# Patient Record
Sex: Female | Born: 1980 | Race: White | Hispanic: No | Marital: Married | Smoking: Former smoker
Health system: Southern US, Community
[De-identification: ages and names within clinical notes are randomized; demographics above are authoritative.]

## PROBLEM LIST (undated history)

## (undated) DIAGNOSIS — M329 Systemic lupus erythematosus, unspecified: Secondary | ICD-10-CM

## (undated) DIAGNOSIS — M3500 Sicca syndrome, unspecified: Secondary | ICD-10-CM

## (undated) DIAGNOSIS — K589 Irritable bowel syndrome without diarrhea: Secondary | ICD-10-CM

## (undated) DIAGNOSIS — Z8619 Personal history of other infectious and parasitic diseases: Secondary | ICD-10-CM

## (undated) DIAGNOSIS — J45909 Unspecified asthma, uncomplicated: Secondary | ICD-10-CM

## (undated) DIAGNOSIS — M8580 Other specified disorders of bone density and structure, unspecified site: Secondary | ICD-10-CM

## (undated) DIAGNOSIS — IMO0002 Reserved for concepts with insufficient information to code with codable children: Secondary | ICD-10-CM

## (undated) DIAGNOSIS — K219 Gastro-esophageal reflux disease without esophagitis: Secondary | ICD-10-CM

## (undated) DIAGNOSIS — B009 Herpesviral infection, unspecified: Secondary | ICD-10-CM

## (undated) DIAGNOSIS — R87629 Unspecified abnormal cytological findings in specimens from vagina: Secondary | ICD-10-CM

## (undated) HISTORY — PX: BREAST SURGERY: SHX581

## (undated) HISTORY — DX: Other specified disorders of bone density and structure, unspecified site: M85.80

## (undated) HISTORY — DX: Personal history of other infectious and parasitic diseases: Z86.19

## (undated) HISTORY — PX: AUGMENTATION MAMMAPLASTY: SUR837

## (undated) HISTORY — PX: GYNECOLOGIC CRYOSURGERY: SHX857

## (undated) HISTORY — PX: MOUTH SURGERY: SHX715

## (undated) HISTORY — DX: Herpesviral infection, unspecified: B00.9

## (undated) HISTORY — DX: Irritable bowel syndrome, unspecified: K58.9

## (undated) HISTORY — DX: Gastro-esophageal reflux disease without esophagitis: K21.9

## (undated) HISTORY — DX: Unspecified abnormal cytological findings in specimens from vagina: R87.629

---
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
## 2001-02-04 ENCOUNTER — Other Ambulatory Visit: Admission: RE | Admit: 2001-02-04 | Discharge: 2001-02-04 | Payer: Self-pay | Admitting: Gynecology

## 2002-01-27 ENCOUNTER — Other Ambulatory Visit: Admission: RE | Admit: 2002-01-27 | Discharge: 2002-01-27 | Payer: Self-pay | Admitting: Gynecology

## 2003-01-23 ENCOUNTER — Other Ambulatory Visit: Admission: RE | Admit: 2003-01-23 | Discharge: 2003-01-23 | Payer: Self-pay | Admitting: Gynecology

## 2013-01-09 ENCOUNTER — Emergency Department (HOSPITAL_BASED_OUTPATIENT_CLINIC_OR_DEPARTMENT_OTHER)
Admission: EM | Admit: 2013-01-09 | Discharge: 2013-01-09 | Disposition: A | Discharge status: Home / Self Care | Payer: 59 | Attending: Emergency Medicine | Admitting: Emergency Medicine

## 2013-01-09 ENCOUNTER — Encounter (HOSPITAL_BASED_OUTPATIENT_CLINIC_OR_DEPARTMENT_OTHER): Payer: Self-pay | Admitting: *Deleted

## 2013-01-09 DIAGNOSIS — Z79899 Other long term (current) drug therapy: Secondary | ICD-10-CM | POA: Insufficient documentation

## 2013-01-09 DIAGNOSIS — Z209 Contact with and (suspected) exposure to unspecified communicable disease: Secondary | ICD-10-CM

## 2013-01-09 DIAGNOSIS — Z2089 Contact with and (suspected) exposure to other communicable diseases: Secondary | ICD-10-CM | POA: Insufficient documentation

## 2013-01-09 DIAGNOSIS — Z8739 Personal history of other diseases of the musculoskeletal system and connective tissue: Secondary | ICD-10-CM | POA: Insufficient documentation

## 2013-01-09 HISTORY — DX: Systemic lupus erythematosus, unspecified: M32.9

## 2013-01-09 HISTORY — DX: Reserved for concepts with insufficient information to code with codable children: IMO0002

## 2013-01-09 MED ORDER — RABIES VACCINE, PCEC IM SUSR
1.0000 mL | Freq: Once | INTRAMUSCULAR | Status: AC
  Administered 2013-01-09: 1 mL via INTRAMUSCULAR
  Filled 2013-01-09: qty 1

## 2013-01-09 MED ORDER — RABIES IMMUNE GLOBULIN 150 UNIT/ML IM INJ
20.0000 [IU]/kg | INJECTION | Freq: Once | INTRAMUSCULAR | Status: AC
  Administered 2013-01-09: 975 [IU] via INTRAMUSCULAR
  Filled 2013-01-09 (×2): qty 8

## 2013-01-09 NOTE — ED Provider Notes (Signed)
History     CSN: [id number]  Arrival date & time 01/09/13  1027   First MD Initiated Contact with Patient 01/09/13 1148      Chief Complaint  Patient presents with  . Animal Bite    (Consider location/radiation/quality/duration/timing/severity/associated sxs/prior treatment) Patient is a 32 y.o. female presenting with animal bite. The history is provided by the patient.  Animal Bite   She noticed a bad was in her house 4 days ago. She To the back in a towel and took an outside. She's not sure how long that was in her house. He was not in her bedroom and, but it had been approximately one day since there is a period of about 20 minutes of her doors and windows were opened but it could have flown in. She was not worried about it since she was not aware of any bite. However, she talked with her physician's office who advised her to come and get rabies vaccination series. She's up-to-date on tetanus immunizations.  Past Medical History  Diagnosis Date  . Lupus     History reviewed. No pertinent past surgical history.  History reviewed. No pertinent family history.  History  Substance Use Topics  . Smoking status: Not on file  . Smokeless tobacco: Not on file  . Alcohol Use: Not on file    OB History   Grav Para Term Preterm Abortions TAB SAB Ect Mult Living                  Review of Systems  All other systems reviewed and are negative.    Allergies  Review of patient's allergies indicates no known allergies.  Home Medications   Current Outpatient Rx  Name  Route  Sig  Dispense  Refill  . lisdexamfetamine (VYVANSE) 40 MG capsule   Oral   Take 40 mg by mouth every morning.         . Prenatal Vit-Fe Fumarate-FA (PRENATAL MULTIVITAMIN) TABS   Oral   Take 1 tablet by mouth daily at 12 noon.         . sodium bicarbonate 650 MG tablet   Oral   Take 650 mg by mouth 2 (two) times daily.           BP 134/84  Pulse 94  Temp(Src) 98.4 F (36.9 C) (Oral)   Resp 18  Wt 105 lb (47.628 kg)  SpO2 100%  Physical Exam  Nursing note and vitals reviewed.  32 year old female, resting comfortably and in no acute distress. Vital signs are normal. Oxygen saturation is 100%, which is normal. Head is normocephalic and atraumatic. PERRLA, EOMI. Oropharynx is clear. Neck is nontender and supple without adenopathy or JVD. Back is nontender and there is no CVA tenderness. Lungs are clear without rales, wheezes, or rhonchi. Chest is nontender. Heart has regular rate and rhythm without murmur. Abdomen is soft, flat, nontender without masses or hepatosplenomegaly and peristalsis is normoactive. Extremities have no cyanosis or edema, full range of motion is present. Skin is warm and dry without rash. Neurologic: Mental status is normal, cranial nerves are intact, there are no motor or sensory deficits.  ED Course  Procedures (including critical care time)   1. Exposure to bat without known bite       MDM  Exposure to a bat without known bite. This is an indication for rabies vaccination. She is given a dose of rabies immune globulin and initial dose of rabies vaccine. She is given  the schedule for additional rabies vaccine administration which will be done in 3 days, 7 days, and 14 days.        Dione Booze, MD 01/09/13 1236

## 2013-01-09 NOTE — ED Notes (Signed)
Pt amb to room 11 with quick steady gait in nad. Pt reports she had her door open Saturday evening, and found it Sunday morning. Was told to come to ed for rabies injections, pt denies any sores or open areas "I don't think it bit me.Marland KitchenMarland Kitchen"

## 2013-01-12 ENCOUNTER — Emergency Department (HOSPITAL_COMMUNITY)
Admission: EM | Admit: 2013-01-12 | Discharge: 2013-01-12 | Disposition: A | Discharge status: Home / Self Care | Payer: 59 | Source: Home / Self Care

## 2013-01-12 ENCOUNTER — Encounter (HOSPITAL_COMMUNITY): Payer: Self-pay | Admitting: *Deleted

## 2013-01-12 DIAGNOSIS — Z203 Contact with and (suspected) exposure to rabies: Secondary | ICD-10-CM

## 2013-01-12 MED ORDER — RABIES VACCINE, PCEC IM SUSR
INTRAMUSCULAR | Status: AC
  Filled 2013-01-12: qty 1

## 2013-01-12 MED ORDER — RABIES VACCINE, PCEC IM SUSR
1.0000 mL | Freq: Once | INTRAMUSCULAR | Status: AC
  Administered 2013-01-12: 1 mL via INTRAMUSCULAR

## 2013-01-12 NOTE — ED Notes (Signed)
Pt      Here  For   Day  3         Of  Rabies     Vaccine

## 2013-01-16 ENCOUNTER — Emergency Department (HOSPITAL_COMMUNITY)
Admission: EM | Admit: 2013-01-16 | Discharge: 2013-01-16 | Disposition: A | Discharge status: Home / Self Care | Payer: 59 | Source: Home / Self Care

## 2013-01-16 ENCOUNTER — Encounter (HOSPITAL_COMMUNITY): Payer: Self-pay | Admitting: Emergency Medicine

## 2013-01-16 DIAGNOSIS — Z203 Contact with and (suspected) exposure to rabies: Secondary | ICD-10-CM

## 2013-01-16 MED ORDER — RABIES VACCINE, PCEC IM SUSR
INTRAMUSCULAR | Status: AC
  Filled 2013-01-16: qty 1

## 2013-01-16 MED ORDER — RABIES VACCINE, PCEC IM SUSR
1.0000 mL | Freq: Once | INTRAMUSCULAR | Status: AC
  Administered 2013-01-16: 1 mL via INTRAMUSCULAR

## 2013-01-16 NOTE — ED Notes (Signed)
Pt here for 3rd rabies injection no new problems tolerated last shot ok. No reactions. Pt is alert and oriented.

## 2013-01-23 ENCOUNTER — Emergency Department (HOSPITAL_COMMUNITY)
Admission: EM | Admit: 2013-01-23 | Discharge: 2013-01-23 | Disposition: A | Discharge status: Home / Self Care | Payer: 59 | Source: Home / Self Care

## 2013-01-23 ENCOUNTER — Encounter (HOSPITAL_COMMUNITY): Payer: Self-pay | Admitting: *Deleted

## 2013-01-23 DIAGNOSIS — Z203 Contact with and (suspected) exposure to rabies: Secondary | ICD-10-CM

## 2013-01-23 MED ORDER — RABIES VACCINE, PCEC IM SUSR
1.0000 mL | Freq: Once | INTRAMUSCULAR | Status: AC
  Administered 2013-01-23: 1 mL via INTRAMUSCULAR

## 2013-01-23 MED ORDER — RABIES VACCINE, PCEC IM SUSR
INTRAMUSCULAR | Status: AC
  Filled 2013-01-23: qty 1

## 2013-01-23 NOTE — ED Notes (Signed)
Pt     Here      For  The     Last  Of  Her  Rabies  Vaccine         She  Verbalizes  No  Problems

## 2013-05-13 ENCOUNTER — Encounter: Payer: Self-pay | Admitting: Internal Medicine

## 2013-05-13 ENCOUNTER — Ambulatory Visit (INDEPENDENT_AMBULATORY_CARE_PROVIDER_SITE_OTHER): Payer: 59 | Admitting: Internal Medicine

## 2013-05-13 VITALS — BP 112/79 | HR 81 | Temp 98.4°F | Ht 64.5 in | Wt 105.0 lb

## 2013-05-13 DIAGNOSIS — M3500 Sicca syndrome, unspecified: Secondary | ICD-10-CM | POA: Insufficient documentation

## 2013-05-13 DIAGNOSIS — I7300 Raynaud's syndrome without gangrene: Secondary | ICD-10-CM

## 2013-05-13 DIAGNOSIS — B351 Tinea unguium: Secondary | ICD-10-CM

## 2013-05-13 DIAGNOSIS — N2589 Other disorders resulting from impaired renal tubular function: Secondary | ICD-10-CM

## 2013-05-13 MED ORDER — FLUCONAZOLE 200 MG PO TABS: 400.0000 mg | ORAL_TABLET | Freq: Every day | ORAL | Status: DC

## 2013-05-13 NOTE — Progress Notes (Signed)
[facility] NOTE  RFV: parynochia  Subjective:    Patient ID: [name] Davis, female    DOB: 1981/02/21, 32 y.o.   MRN: [id number]  HPI 32yo sjorgren's syndrome, Raynaud's syndrome who reports having a non-healing cuticle infection in 4th finger of right hand, she has had multiple culture including raoutella planticola, and enterococcus, placed on cipro and pcn x 10 days, then recurred after 2 wks; then was re-initiated on antibiotics, to bactrim X 10d which improved. Then on aug 9th, it looked ok but then became worse the following week, where she was placed on ceftin for 10 days. where she was re cultures on august 27th with klebsiella oxytoca, then was given the levoquin for k.oxytoca (pan-sensitive). Now off of antibiotics for the past 14 days.she continues to soak her hand with epsom salt soaks. She is attempting to not pick at her hands/cuticles  Current Outpatient Prescriptions on File Prior to Visit  Medication Sig Dispense Refill  . lisdexamfetamine (VYVANSE) 40 MG capsule Take 40 mg by mouth every morning.      . Prenatal Vit-Fe Fumarate-FA (PRENATAL MULTIVITAMIN) TABS Take 1 tablet by mouth daily at 12 noon.      . sodium bicarbonate 650 MG tablet Take 650 mg by mouth 2 (two) times daily.       No current facility-administered medications on file prior to visit.   Active Ambulatory Problems    Diagnosis Date Noted  . Sjogren's syndrome 05/13/2013  . 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 Raynauds syndrome 05/13/2013   Resolved Ambulatory Problems    Diagnosis Date Noted  . No Resolved Ambulatory Problems   Past Medical History  Diagnosis Date  . Lupus    History  Substance Use Topics  . Smoking status: Former Smoker    Types: Cigarettes  . Smokeless tobacco: Not on file  . Alcohol Use: Yes     Comment: occassional   family history is not on file.  Review of Systems  Constitutional: Negative for fever, chills, diaphoresis, activity change, appetite change,  fatigue and unexpected weight change.  HENT: Negative for congestion, sore throat, rhinorrhea, sneezing, trouble swallowing and sinus pressure.  Eyes: Negative for photophobia and visual disturbance.  Respiratory: Negative for cough, chest tightness, shortness of breath, wheezing and stridor.  Cardiovascular: Negative for chest pain, palpitations and leg swelling.  Gastrointestinal: Negative for nausea, vomiting, abdominal pain, diarrhea, constipation, blood in stool, abdominal distention and anal bleeding.  Genitourinary: Negative for dysuria, hematuria, flank pain and difficulty urinating.  Musculoskeletal: Negative for myalgias, back pain, joint swelling, arthralgias and gait problem.  Skin: Negative for color change, pallor, rash and wound.  Neurological: Negative for dizziness, tremors, weakness and light-headedness.  Hematological: Negative for adenopathy. Does not bruise/bleed easily.  Psychiatric/Behavioral: Negative for behavioral problems, confusion, sleep disturbance, dysphoric mood, decreased concentration and agitation.       Objective:   Physical Exam BP 112/79  Pulse 81  Temp(Src) 98.4 F (36.9 C) (Oral)  Ht 5' 4.5" (1.638 m)  Wt 105 lb (47.628 kg)  BMI 17.75 kg/m2  LMP 04/15/2013 Physical Exam  Constitutional: oriented to person, place, and time.  appears well-developed and well-nourished. No distress.  HENT:  Mouth/Throat: Oropharynx is clear and moist. No oropharyngeal exudate.  Cardiovascular: Normal rate, regular rhythm and normal heart sounds. Exam reveals no gallop and no friction rub.  No murmur heard.  Pulmonary/Chest: Effort normal and breath sounds normal. No respiratory distress.  no wheezes.  Abdominal: Soft. Bowel sounds are normal.  He exhibits no distension. There is no tenderness.  Lymphadenopathy: no cervical adenopathy.  Neurological: alert and oriented to person, place, and time.  Skin: Skin is warm and dry. No rash noted. No erythema.  Ext: 4th  digit of right hand has mild blanching erythema to the distal tip, her cuticle area is slightly edematous and raised from the nail. The nail has some irregularity with the medial lateral aspect of the nail being undermined. No purulent discharge or fluctuance noted. Psychiatric:  normal mood and affect. behavior is normal.      Assessment & Plan:  parynochia +/- onychomycosis = will do a trial of fluconazole 400mg  daily x 10 day. Will hold off on any further antimicrobials since she has had repeated courses of antibiotics. If it recurs, she may benefit from an I x D incase she has a deep abscess that is contributing to the recurrence of the parynochia. Continue with epson salt soaks and minimize picking at her hand  RTC 10 days

## 2013-05-21 ENCOUNTER — Ambulatory Visit (INDEPENDENT_AMBULATORY_CARE_PROVIDER_SITE_OTHER): Payer: 59 | Admitting: Internal Medicine

## 2013-05-21 ENCOUNTER — Encounter: Payer: Self-pay | Admitting: Internal Medicine

## 2013-05-21 VITALS — BP 106/73 | HR 72 | Temp 98.2°F | Wt 107.0 lb

## 2013-05-21 DIAGNOSIS — B351 Tinea unguium: Secondary | ICD-10-CM

## 2013-05-21 NOTE — Progress Notes (Signed)
[facility] NOTE  RFV: routine Subjective:    Patient ID: [name] Davis, female    DOB: 09-Feb-1981, 32 y.o.   MRN: [id number]  HPI [name] Davis is a 32yo F with Sjogren's syndrome and Raynaud's who was seen last week for parynochia of 4th finger of her right hand, where she had received several courses of antibiotics for cultures of kleb oxytoca. She was seen last week, where she was started on fluconazole due to possible fungal infection.onychomycosis. She continues to do soaks of her finger and attempts to not pick at her finger nails. She states that her finger looks improved  Current Outpatient Prescriptions on File Prior to Visit  Medication Sig Dispense Refill  . fluconazole (DIFLUCAN) 200 MG tablet Take 2 tablets (400 mg total) by mouth daily.  28 tablet  0  . lisdexamfetamine (VYVANSE) 40 MG capsule Take 40 mg by mouth every morning.      . Prenatal Vit-Fe Fumarate-FA (PRENATAL MULTIVITAMIN) TABS Take 1 tablet by mouth daily at 12 noon.      . sodium bicarbonate 650 MG tablet Take 650 mg by mouth 2 (two) times daily.      . traZODone (DESYREL) 100 MG tablet        No current facility-administered medications on file prior to visit.   Active Ambulatory Problems    Diagnosis Date Noted  . Sjogren's syndrome 05/13/2013  . 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 Raynauds syndrome 05/13/2013   Resolved Ambulatory Problems    Diagnosis Date Noted  . No Resolved Ambulatory Problems   Past Medical History  Diagnosis Date  . Lupus        Review of Systems     Objective:   Physical Exam BP 106/73  Pulse 72  Temp(Src) 98.2 F (36.8 C) (Oral)  Wt 107 lb (48.535 kg)  LMP 04/15/2013 gen = a x o by 3 in NAD Ext: doing better than las week, less swelling and erythema      Assessment & Plan:  Onychomycosis/parynochia= finish course of fluconazole  rtc prn

## 2014-04-01 ENCOUNTER — Other Ambulatory Visit (HOSPITAL_COMMUNITY): Payer: Self-pay | Admitting: Family Medicine

## 2014-04-01 DIAGNOSIS — M8580 Other specified disorders of bone density and structure, unspecified site: Secondary | ICD-10-CM

## 2014-04-16 ENCOUNTER — Ambulatory Visit (HOSPITAL_COMMUNITY): Payer: 59

## 2014-04-17 ENCOUNTER — Ambulatory Visit (HOSPITAL_COMMUNITY)
Admission: RE | Admit: 2014-04-17 | Discharge: 2014-04-17 | Disposition: A | Discharge status: Home / Self Care | Payer: 59 | Source: Ambulatory Visit | Attending: Family Medicine | Admitting: Family Medicine

## 2014-04-17 DIAGNOSIS — M8580 Other specified disorders of bone density and structure, unspecified site: Secondary | ICD-10-CM

## 2014-04-17 DIAGNOSIS — M949 Disorder of cartilage, unspecified: Secondary | ICD-10-CM | POA: Diagnosis not present

## 2014-04-17 DIAGNOSIS — Z13820 Encounter for screening for osteoporosis: Secondary | ICD-10-CM | POA: Insufficient documentation

## 2014-04-17 DIAGNOSIS — M899 Disorder of bone, unspecified: Secondary | ICD-10-CM | POA: Diagnosis not present

## 2014-06-23 LAB — OB RESULTS CONSOLE ANTIBODY SCREEN: Antibody Screen: NEGATIVE

## 2014-06-23 LAB — OB RESULTS CONSOLE RPR: RPR: NONREACTIVE

## 2014-06-23 LAB — OB RESULTS CONSOLE ABO/RH: RH TYPE: POSITIVE

## 2014-06-23 LAB — OB RESULTS CONSOLE RUBELLA ANTIBODY, IGM: Rubella: NON-IMMUNE/NOT IMMUNE

## 2014-06-23 LAB — OB RESULTS CONSOLE HEPATITIS B SURFACE ANTIGEN: HEP B S AG: NEGATIVE

## 2014-06-23 LAB — OB RESULTS CONSOLE HIV ANTIBODY (ROUTINE TESTING): HIV: NONREACTIVE

## 2014-07-03 LAB — OB RESULTS CONSOLE GC/CHLAMYDIA
Chlamydia: NEGATIVE
GC PROBE AMP, GENITAL: NEGATIVE

## 2014-07-14 ENCOUNTER — Encounter (HOSPITAL_COMMUNITY): Payer: Self-pay

## 2014-07-14 ENCOUNTER — Ambulatory Visit (HOSPITAL_COMMUNITY)
Admission: RE | Admit: 2014-07-14 | Discharge: 2014-07-14 | Disposition: A | Discharge status: Home / Self Care | Payer: 59 | Source: Ambulatory Visit | Attending: Obstetrics and Gynecology | Admitting: Obstetrics and Gynecology

## 2014-07-14 ENCOUNTER — Other Ambulatory Visit (HOSPITAL_COMMUNITY): Payer: Self-pay

## 2014-07-14 VITALS — BP 122/77 | HR 72 | Wt 116.2 lb

## 2014-07-14 DIAGNOSIS — D6862 Lupus anticoagulant syndrome: Secondary | ICD-10-CM | POA: Diagnosis not present

## 2014-07-14 DIAGNOSIS — O99111 Other diseases of the blood and blood-forming organs and certain disorders involving the immune mechanism complicating pregnancy, first trimester: Secondary | ICD-10-CM | POA: Insufficient documentation

## 2014-07-14 DIAGNOSIS — Z87891 Personal history of nicotine dependence: Secondary | ICD-10-CM | POA: Insufficient documentation

## 2014-07-14 DIAGNOSIS — O361910 Maternal care for other isoimmunization, first trimester, not applicable or unspecified: Secondary | ICD-10-CM

## 2014-07-14 DIAGNOSIS — M3500 Sicca syndrome, unspecified: Secondary | ICD-10-CM | POA: Diagnosis not present

## 2014-07-14 DIAGNOSIS — Z3A12 12 weeks gestation of pregnancy: Secondary | ICD-10-CM | POA: Diagnosis not present

## 2014-07-14 DIAGNOSIS — M329 Systemic lupus erythematosus, unspecified: Secondary | ICD-10-CM | POA: Insufficient documentation

## 2014-07-14 DIAGNOSIS — O9989 Other specified diseases and conditions complicating pregnancy, childbirth and the puerperium: Secondary | ICD-10-CM

## 2014-07-14 DIAGNOSIS — N2589 Other disorders resulting from impaired renal tubular function: Secondary | ICD-10-CM | POA: Diagnosis not present

## 2014-07-14 DIAGNOSIS — O26831 Pregnancy related renal disease, first trimester: Secondary | ICD-10-CM

## 2014-07-14 DIAGNOSIS — O99891 Other specified diseases and conditions complicating pregnancy: Secondary | ICD-10-CM | POA: Insufficient documentation

## 2014-07-14 NOTE — Progress Notes (Signed)
MATERNAL FETAL MEDICINE CONSULT  Patient Name: [name] HessJennifer Davis Medical Record Number:  [id number] Date of Birth: [birth year] Requesting Physician Name:  Meriel Picaichard M Holland, MD Date of Service: 07/14/2014  Chief Complaint Lupus/Sjogrens in pregnancy, +SSA/+SSB antibodies  History of Present Illness [name] Davis was seen today for prenatal diagnosis secondary to Lupus/Sjogrens in pregnancy, as well as +SSA/+SSB antibodies at the request of Meriel Picaichard M Holland MD.  The patient is a 33 y.o. G1 @ 7232w2d gestation today with EDC of 01/24/15.  Ms. [name] Davis has had a SLE dx for "several years" as well as sjogrens. She sees [facility] Del[facility].  She reports a history of minimal lupus activity but does report salivary stones/corneal ulcers/dry mouth/dry eyes with her sjogrens.  She has been tested for SSA/SSB and is positive for both antibodies.  She reports no recent disease flares or activity.  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  She reports her 24 hr urines in the past have been in the 200-400 range.  She sees Dr. Carolynn CommentKelly [place] for her renal disease and her next appointment is later today.  She denies any further concerns.  Denies vaginal bleeding or cramping.   Review of Systems Pertinent items are noted in HPI.  Patient History OB History  No data available    Past Medical History  Diagnosis Date  . Lupus     History reviewed. No pertinent past surgical history.  History   Social History  . Marital Status: Single    Spouse Name: N/A    Number of Children: N/A  . Years of Education: N/A   Social History Main Topics  . Smoking status: Former Smoker    Types: Cigarettes  . Smokeless tobacco: None  . Alcohol Use: Yes     Comment: occassional  . Drug Use: No  . Sexual Activity:    Partners: Male   Other Topics Concern  . None   Social History Narrative    History reviewed.  No pertinent family history. In addition, the patient has no family history of mental retardation, birth defects, or genetic diseases.  Physical Examination Wt 116 lbs, BP 122/77 P 72 General appearance - alert, well appearing, and in no distress  Assessment and Recommendations  Lupus and other autoimmune vasculitides can lead to nephritis and hypertension which can arise for the first time or become exacerbated in pregnancy. Although it is generally accepted that pregnancy and the postpartum period are associated with a higher rate of systemic lupus erythematosus (SLE) disease flares, widely variable rates have been reported ranging from 25 to 60 percent.  Active disease in the 6 months prior to conception, lupus nephritis and discontinuation of hydroxychloroquine are associated with increased risk of flares.   Women with SLE also had a two- to fourfold increased rate of obstetric complications including preterm labor, unplanned cesarean delivery, fetal growth restriction, preeclampsia, and eclampsia. Patients with SLE also had a significantly higher risk of thrombosis, infection, thrombocytopenia, and transfusion. Fetal risks include higher rates of miscarriage, stillbirth.  Fetal growth restriction risk is also increased and serial growth evaluations should occur. Neonatal lupus is a passively transferred autoimmune disease that occurs in some babies born to mothers with anti-Ro/SSA or anti-LA/SSB antibodies.  The major manifestations of neonatal lupus are either cutaneous or cardiac, but other manifestations include hematologic and hepatic abnormalities. The pediatricians need to be notified of maternal lupus with  SSA/SSB antibodies.   The most serious complication in the neonate is congenital complete heart block, which occurs in approximately 2 percent of children born to primigravid women with anti-Ro/SSA antibodies. The presence of SS/A or SS/B antibodies carries approximately a 5% risk of  congenital heart block. Ms. [name] Davis does have + SSA and + SSB antibodies per her care everywhere records.    Weekly ultrasound from the 16h through the 26th week of pregnancy and then every other week until 32 weeks should be strongly considered. I recommend a fetal echo at 16 weeks and a detailed anatomic survey at 18 weeks.  The most vulnerable period for the fetus is during the period from 18 to [redacted] weeks gestation. Normal sinus rhythm (NSR) can progress to complete block in seven days during this high-risk period. New onset of heart block is less likely during the 26th through the [facility]30thweek, and it rarely develops after 30 weeks of pregnancy.  She will also require serial fetal growth evaluation and antenatal testing to begin at 32 weeks.  To establish a baseline, a 24 hour urine collection and serum creatinine should be performed to determine her baseline creatinine clearance and proteinuria as well as a CBC, CMP. This should be repeated each trimester and as clinically indicated based on disease symptoms or the appearance of hypertension.   Ms. [name] Davis sees Dr. Lacy DuverneyGoldsboro with nephrology today for advice regarding management of her renal disease.  None of these labs/scans were ordered today.    I spent 60 minutes with the patient with greater than 50% devoted to face to face counseling on the above.    Molly MaduroSTREET, Cale Decarolis, MD

## 2014-07-14 NOTE — Addendum Note (Signed)
Encounter addended by: Ty HiltsKatherine E Jame Seelig, RN on: 07/14/2014  2:02 PM<BR>     Documentation filed: Flowsheet VN, OB Prenatal Vitals, OB Dating, Episodes

## 2014-08-28 NOTE — L&D Delivery Note (Signed)
SVD of VFI at 1036 on 01/06/15.  EBL 200cc.  APGARs 8,9.  Placenta to L&D. Head delivered OA with loose nuchal; delivered through. Body followed atraumatically.  Cord was clamped, cut and baby to abdomen.  Cord blood was obtained.  Placenta delivered S/I/3VC. Fundus was firmed with pitocin and massage.  2nd degree perineal lac was repaired with 3-0 Rapide in the normal fashion.  Mom and baby stable.

## 2014-11-02 ENCOUNTER — Telehealth: Payer: Self-pay | Admitting: Family Medicine

## 2014-11-02 NOTE — Telephone Encounter (Signed)
Spoke to pt, scheduled her 3.14.16 @ 11:30a.

## 2014-11-02 NOTE — Telephone Encounter (Signed)
Pt called in would like to know if Dr Katrinka Blazingsmith could possibly work her in this week?  Foot pain.     [phone]

## 2014-11-09 ENCOUNTER — Encounter: Payer: Self-pay | Admitting: Family Medicine

## 2014-11-09 ENCOUNTER — Ambulatory Visit (INDEPENDENT_AMBULATORY_CARE_PROVIDER_SITE_OTHER): Payer: 59 | Admitting: Family Medicine

## 2014-11-09 ENCOUNTER — Other Ambulatory Visit (INDEPENDENT_AMBULATORY_CARE_PROVIDER_SITE_OTHER): Payer: 59

## 2014-11-09 VITALS — BP 116/80 | HR 127 | Ht 64.5 in

## 2014-11-09 DIAGNOSIS — M25571 Pain in right ankle and joints of right foot: Secondary | ICD-10-CM

## 2014-11-09 DIAGNOSIS — M7671 Peroneal tendinitis, right leg: Secondary | ICD-10-CM | POA: Diagnosis not present

## 2014-11-09 NOTE — Progress Notes (Signed)
Tawana ScaleZach Ronesha Heenan D.O. [facility] [address] [place], [place] [address] Phone: [phone] Subjective:     CC: Right ankle pain  GLO:[id number]:Subjective [name] Davis is a 34 y.o. female coming in with complaint of right ankle pain. Patient is [redacted] weeks pregnant. Patient has noticed right ankle pain. Patient has been seen by other providers and has been treated for more of an ankle sprain. Patient stated that they told her it was more secondary to the weight that she gained. Patient was having pain on the lateral aspect the ankle. Describes it as more of a dull throbbing aching sensation. Patient does have a past medical history significant for lupus syndrome. Patient has been in a [employer] systems [employer] for the last 2 weeks and does not seem to be making any significant improvement.     Past medical history, social, surgical and family history all reviewed in electronic medical record.   Review of Systems: No headache, visual changes, nausea, vomiting, diarrhea, constipation, dizziness, abdominal pain, skin rash, fevers, chills, night sweats, weight loss, swollen lymph nodes, body aches, joint swelling, muscle aches, chest pain, shortness of breath, mood changes.   Objective Blood pressure 116/80, pulse 127, height 5' 4.5" (1.638 m), last menstrual period 04/19/2014, SpO2 99 %.  General: No apparent distress alert and oriented x3 mood and affect normal, dressed appropriately.  HEENT: Pupils equal, extraocular movements intact  Respiratory: Patient's speak in full sentences and does not appear short of breath  Cardiovascular: No lower extremity edema, non tender, no erythema  Skin: Warm dry intact with no signs of infection or rash on extremities or on axial skeleton.  Abdomen: Soft nontender  Neuro: Cranial nerves II through XII are intact, neurovascularly intact in all extremities with 2+ DTRs and 2+ pulses.  Lymph: No lymphadenopathy of posterior or anterior cervical chain or axillae  bilaterally.  Gait normal with good balance and coordination.  MSK:  Non tender with full range of motion and good stability and symmetric strength and tone of shoulders, elbows, wrist, hip, knee bilaterally.  Ankle: Right No visible erythema or swelling. Range of motion is full in all directions. Strength is 5/5 in all directions. Stable lateral and medial ligaments; squeeze test and kleiger test unremarkable; Talar dome nontender; No pain at base of 5th MT; No tenderness over cuboid; No tenderness over N spot or navicular prominence Mild tenderness over the lateral malleolus but not on the medial malleolus. Minimal tenderness over the peroneal tendons and the posterior lateral malleolus Negative tarsal tunnel tinel's Able to walk 4 steps. Contralateral ankle unremarkable  MSK US performed of: Right This study was ordered, performed, and interpreted by Terrilee FilesZach Blessyn [place] D.O.  Foot/Ankle:   All structures visualized.   Talar dome unremarkable  Ankle mortise without effusion. Mild peroneal tendon sheath effusion and unfortunately patient does have what appears to be swelling right over the tip of the lateral malleolus. No significant increase in Doppler flow. Posterior tibialis, flexor hallucis longus, and flexor digitorum longus tendons unremarkable on long and transverse views without sheath effusions. Achilles tendon visualized along length of tendon and unremarkable on long and transverse views without sheath effusion. Anterior Talofibular Ligament and Calcaneofibular Ligaments unremarkable and intact. Deltoid Ligament unremarkable and intact. Plantar fascia intact and without effusion, normal thickness. No increased doppler signal, cap sign, or thickening of tibial cortex. Power doppler signal normal.  IMPRESSION:  Peroneal tendinitis     Impression and Recommendations:     This case required medical decision making  of moderate complexity.

## 2014-11-09 NOTE — Assessment & Plan Note (Signed)
I feel that the boot is restricting patient to much at this time. We discussed icing regimen and home exercises. Patient was put in a air cast which I think will allow for mobilization but still allow patient ambulate better. We discussed icing regimen. We will avoid any oral anti-inflammatories N/A to her being pregnant. Discussed with her brother use Tylenol. Patient will try to make these changes and come back and see me again in 3 weeks if not completely resolved.

## 2014-11-09 NOTE — Patient Instructions (Signed)
Good to see you Ice 15 minutes 2 times daily. Usually after activity and before bed. Exercises 3 times a week.  Wear the brace daily but not at night See me again in 3 weeks if not perfect

## 2014-12-03 LAB — US OB FOLLOW UP

## 2014-12-11 ENCOUNTER — Other Ambulatory Visit (HOSPITAL_COMMUNITY): Payer: Self-pay | Admitting: Obstetrics and Gynecology

## 2014-12-11 ENCOUNTER — Encounter (HOSPITAL_COMMUNITY): Payer: Self-pay | Admitting: Obstetrics and Gynecology

## 2014-12-11 ENCOUNTER — Encounter (HOSPITAL_COMMUNITY): Payer: Self-pay

## 2014-12-11 ENCOUNTER — Ambulatory Visit (HOSPITAL_COMMUNITY)
Admission: RE | Admit: 2014-12-11 | Discharge: 2014-12-11 | Disposition: A | Discharge status: Home / Self Care | Payer: 59 | Source: Ambulatory Visit | Attending: Obstetrics and Gynecology | Admitting: Obstetrics and Gynecology

## 2014-12-11 VITALS — BP 117/67 | HR 75 | Wt 139.0 lb

## 2014-12-11 DIAGNOSIS — N2589 Other disorders resulting from impaired renal tubular function: Secondary | ICD-10-CM | POA: Diagnosis not present

## 2014-12-11 DIAGNOSIS — Z3A33 33 weeks gestation of pregnancy: Secondary | ICD-10-CM

## 2014-12-11 DIAGNOSIS — D6862 Lupus anticoagulant syndrome: Secondary | ICD-10-CM | POA: Diagnosis not present

## 2014-12-11 DIAGNOSIS — M329 Systemic lupus erythematosus, unspecified: Secondary | ICD-10-CM

## 2014-12-11 DIAGNOSIS — O99113 Other diseases of the blood and blood-forming organs and certain disorders involving the immune mechanism complicating pregnancy, third trimester: Secondary | ICD-10-CM | POA: Diagnosis not present

## 2014-12-11 DIAGNOSIS — M3500 Sicca syndrome, unspecified: Secondary | ICD-10-CM | POA: Diagnosis not present

## 2014-12-11 DIAGNOSIS — Z87891 Personal history of nicotine dependence: Secondary | ICD-10-CM | POA: Insufficient documentation

## 2014-12-11 DIAGNOSIS — O9989 Other specified diseases and conditions complicating pregnancy, childbirth and the puerperium: Secondary | ICD-10-CM

## 2014-12-11 HISTORY — DX: Sjogren syndrome, unspecified: M35.00

## 2014-12-11 HISTORY — DX: Unspecified asthma, uncomplicated: J45.909

## 2014-12-11 NOTE — Progress Notes (Signed)
Reason for consult Lupus/Sjogrens in pregnancy, +SSA/+SSB antibodies, route and timing of delivery  History of Present Illness [name] Davis was seen today for prenatal diagnosis secondary to Lupus/Sjogrens in pregnancy, as well as +SSA/+SSB antibodies at the request of Meriel Pica MD. The patient is a 34 y.o. G1 @ 33 weeks 5 days gestation today with EDC of 01/24/15. [name] Davis has had a SLE dx for "several years" as well as sjogrens. She sees Dr. Delton Prairie at [facility] rheumatology. She reports a history of minimal lupus activity but does report salivary stones/corneal ulcers/dry mouth/dry eyes with her sjogrens. She has been tested for SSA/SSB and is positive for both antibodies. She reports no recent disease flares or activity. 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 She reports her 24 hr urines in the past have been in the 200-400 range. She sees Dr. Carolynn Comment for her renal disease and had an appointment yesterday. She denies any further concerns. She notes active fetal movement and denies vaginal bleeding, contractions/cramping or ROM .  Review of Systems Pertinent items are noted in HPI.  Patient History OB History  G1    Past Medical History  Diagnosis Date  . Lupus     History reviewed. No pertinent past surgical history.  History   Social History  . Marital Status: Single    Spouse Name: N/A    Number of Children: N/A  . Years of Education: N/A   Social History Main Topics  . Smoking status: Former Smoker    Types: Cigarettes  . Smokeless tobacco: None  . Alcohol Use: Yes     Comment: occassional  . Drug Use: No  . Sexual Activity:    Partners: Male   Other Topics Concern  . None   Social History Narrative    Physical Examination Wt 139 lbs, BP 117/67 P 72 General appearance - alert, well  appearing, and in no distress  Assessment and Recommendations  Lupus and other autoimmune vasculitides can lead to nephritis and hypertension which can arise for the first time or become exacerbated in pregnancy. Although it is generally accepted that pregnancy and the postpartum period are associated with a higher rate of systemic lupus erythematosus (SLE) disease flares, widely variable rates have been reported ranging from 25 to 60 percent. Active disease in the 6 months prior to conception, lupus nephritis and discontinuation of hydroxychloroquine are associated with increased risk of flares.  Women with SLE also had a two- to fourfold increased rate of obstetric complications including preterm labor, unplanned cesarean delivery, fetal growth restriction, preeclampsia, and eclampsia. Patients with SLE also had a significantly higher risk of thrombosis, infection, thrombocytopenia, and transfusion. Fetal risks include higher rates of miscarriage, stillbirth. Fetal growth restriction risk is also increased and serial growth evaluations should occur. Neonatal lupus is a passively transferred autoimmune disease that occurs in some babies born to mothers withanti-Ro/SSAoranti-LA/SSBantibodies. The major manifestations of neonatal lupus are either cutaneous or cardiac, but other manifestations include hematologic and hepatic abnormalities. The pediatricians need to be notified of maternal lupus with SSA/SSB antibodies.  The most serious complication in the neonate is congenital complete heart block, which occurs in approximately 2 percent of children born to primigravid women withanti-Ro/SSAantibodies. The presence of SS/A or SS/B antibodies carries approximately a 5% risk of congenital heart block. [name] Davis does have + SSA and + SSB antibodies per her care everywhere records.  Weekly ultrasound from the 16hthrough the 26thweek of pregnancy and then every other week until 32 weeks should be  strongly considered. I recommend a fetal echo at 16 weeks and a detailed anatomic survey at 18 weeks. The most vulnerable period for the fetus is during the period from 18 to [redacted] weeks gestation. Normal sinus rhythm (NSR) can progress to complete block in seven days during this high-risk period. New onset of heart block is less likely during the 26ththrough the 30thweek, and it rarely develops after 30 weeks of pregnancy. She will also require serial fetal growth evaluation and antenatal testing to begin at 32 weeks. To establish a baseline, a 24 hour urine collection and serum creatinine should be performed to determine her baseline creatinine clearance and proteinuria as well as a CBC, CMP. This should be repeated each trimester and as clinically indicated based on disease symptoms or the appearance of hypertension.   [name] Davis sees Dr. Lacy Duverney with nephrology today for advice regarding management of her renal disease.  None of these labs/scans were ordered today.   I spent 60 minutes with the patient with greater than 50% devoted to face to face counseling on the above.      Chief Complaint Lupus/Sjogrens in pregnancy, +SSA/+SSB antibodies  History of Present Illness [name] Davis was seen today for prenatal diagnosis secondary to Lupus/Sjogrens in pregnancy, as well as +SSA/+SSB antibodies at the request of Meriel Pica MD. The patient is a 34 y.o. G1 @ [redacted]w[redacted]d gestation today with EDC of 01/24/15. [name] Davis has had a SLE dx for "several years" as well as sjogrens. She sees Dr. Delton Prairie at [facility]ology. She reports a history of minimal lupus activity but does report salivary stones/corneal ulcers/dry mouth/dry eyes with her sjogrens. She has been tested for SSA/SSB and is positive for both antibodies. She reports no recent disease flares or activity. 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 She reports her 24 hr urines in the past have been in the 200-400 range. She sees Dr. Carolynn Comment for her renal disease and her next appointment is later today. She denies any further concerns. Denies vaginal bleeding or cramping.  Review of Systems Pertinent items are noted in HPI.  Patient History OB History  No data available    Past Medical History  Diagnosis Date  . Lupus     History reviewed. No pertinent past surgical history.  History   Social History  . Marital Status: Single    Spouse Name: N/A    Number of Children: N/A  . Years of Education: N/A   Social History Main Topics  . Smoking status: Former Smoker    Types: Cigarettes  . Smokeless tobacco: None  . Alcohol Use: Yes     Comment: occassional  . Drug Use: No  . Sexual Activity:    Partners: Male   Other Topics Concern  . None   Social History Narrative    History reviewed. No pertinent family history. In addition, the patient has no family history of mental retardation, birth defects, or genetic diseases.  Physical Examination Wt 116 lbs, BP 122/77 P 72 General appearance - alert, well appearing, and in no distress  Assessment and Recommendations  No interval problems or issues since prior consult. Normal interval fetal growth and amniotic fluid volume on ultrasound reports from her primary provider. Discussed outcomes of baby's born late preterm  and term. If no obstetric or medical issues in the coming weeks, would consider induction at [redacted] weeks gestation. Continue serial antenatal testing and evaluation for fetal growth and amniotic fluid volume. No indication for cesarean except for routine obstetric indications at this point.  I spent 20 minutes with the patient with greater than 50% devoted to face to face counseling on the above.

## 2014-12-18 ENCOUNTER — Encounter: Payer: Self-pay | Admitting: Family Medicine

## 2014-12-18 ENCOUNTER — Ambulatory Visit (INDEPENDENT_AMBULATORY_CARE_PROVIDER_SITE_OTHER): Payer: 59 | Admitting: Family Medicine

## 2014-12-18 VITALS — BP 124/82 | HR 75 | Ht 64.0 in | Wt 139.0 lb

## 2014-12-18 DIAGNOSIS — M7671 Peroneal tendinitis, right leg: Secondary | ICD-10-CM

## 2014-12-18 NOTE — Assessment & Plan Note (Signed)
Discussed with patient at great length. Patient elected to not try an injection at this time patient cannot do any significant medications well she is pregnant. Patient will continue the medications that she is on at this time and we will continue the home exercises. Patient does have some compression sleeves that will help with some of the swelling. Patient will though come back after 4-6 weeks postpartum and we will consider custom orthotics due to patient's overpronation. This will help with decreasing the progression of the

## 2014-12-18 NOTE — Progress Notes (Signed)
Pre visit review using our clinic review tool, if applicable. No additional management support is needed unless otherwise documented below in the visit note. 

## 2014-12-18 NOTE — Patient Instructions (Signed)
Good to see you You are going to do great! Ice when you need it Continue the exercises  See me again 3-6 weeks after the baby and we can consider orthotics.

## 2014-12-18 NOTE — Progress Notes (Signed)
Tawana ScaleZach Smith D.O.  Sports Medicine [address] [place], [place] [address] Phone: [phone] Subjective:     CC: Right ankle pain  MVH:QIONGEXBMWHPI:Subjective [name] Davis is a 34 y.o. female coming in with complaint of right ankle pain. Patient is [redacted] weeks pregnant. Patient has been doing relatively well but continues to have some ankle pain on the lateral aspects of the ankle. Patient has gotten different shoes which is made some mild improvement. Patient states that this pain has been worse and she has been in pregnancy. Patient has noticed some mild increase in swelling as well. Nothing that his stopping her from activities.     Past medical history, social, surgical and family history all reviewed in electronic medical record.   Review of Systems: No headache, visual changes, nausea, vomiting, diarrhea, constipation, dizziness, abdominal pain, skin rash, fevers, chills, night sweats, weight loss, swollen lymph nodes, body aches, joint swelling, muscle aches, chest pain, shortness of breath, mood changes.   Objective Blood pressure 124/82, pulse 75, height 5\' 4"  (1.626 m), weight 139 lb (63.05 kg), last menstrual period 04/19/2014.  General: No apparent distress alert and oriented x3 mood and affect normal, dressed appropriately.  HEENT: Pupils equal, extraocular movements intact  Respiratory: Patient's speak in full sentences and does not appear short of breath  Cardiovascular: No lower extremity edema, non tender, no erythema  Skin: Warm dry intact with no signs of infection or rash on extremities or on axial skeleton.  Abdomen: Soft nontender  Neuro: Cranial nerves II through XII are intact, neurovascularly intact in all extremities with 2+ DTRs and 2+ pulses.  Lymph: No lymphadenopathy of posterior or anterior cervical chain or axillae bilaterally.  Gait normal with good balance and coordination.  MSK:  Non tender with full range of motion and good stability and symmetric strength  and tone of shoulders, elbows, wrist, hip, knee bilaterally.  Ankle: Right No visible erythema or swelling. Range of motion is full in all directions. Strength is 5/5 in all directions. Stable lateral and medial ligaments; squeeze test and kleiger test unremarkable; Talar dome nontender; No pain at base of 5th MT; No tenderness over cuboid; No tenderness over N spot or navicular prominence Mild tenderness over the lateral malleolus but not on the medial malleolus. Minimal tenderness over the peroneal tendons and the posterior lateral malleolus Negative tarsal tunnel tinel's Able to walk 4 steps. Contralateral ankle unremarkable  Foot exam shows the patient has severe over pronation of the midfoot and some mild supination of the hindfoot especially with walking. Weakness of the peroneal's noted.  MSK US performed of: Right This study was ordered, performed, and interpreted by Terrilee FilesZach Smith D.O.  Foot/Ankle:   All structures visualized.   Talar dome unremarkable  Ankle mortise without effusion. Mild peroneal tendon sheath effusion and unfortunately patient does have what appears to be swelling right over the tip of the lateral malleolus. No significant increase in Doppler flow. Posterior tibialis, flexor hallucis longus, and flexor digitorum longus tendons unremarkable on long and transverse views without sheath effusions. Achilles tendon visualized along length of tendon and unremarkable on long and transverse views without sheath effusion. Anterior Talofibular Ligament and Calcaneofibular Ligaments unremarkable and intact. Deltoid Ligament unremarkable and intact. Plantar fascia intact and without effusion, normal thickness. No increased doppler signal, cap sign, or thickening of tibial cortex. Power doppler signal normal.  IMPRESSION:  Peroneal tendinitis bilaterally still present no significant change     Impression and Recommendations:  This case required medical decision  making of moderate complexity.

## 2014-12-28 LAB — OB RESULTS CONSOLE GBS: GBS: POSITIVE

## 2015-01-04 ENCOUNTER — Telehealth (HOSPITAL_COMMUNITY): Payer: Self-pay | Admitting: *Deleted

## 2015-01-04 ENCOUNTER — Encounter (HOSPITAL_COMMUNITY): Payer: Self-pay

## 2015-01-04 ENCOUNTER — Encounter (HOSPITAL_COMMUNITY): Payer: Self-pay | Admitting: *Deleted

## 2015-01-04 ENCOUNTER — Inpatient Hospital Stay (HOSPITAL_COMMUNITY)
Admission: RE | Admit: 2015-01-04 | Discharge: 2015-01-08 | DRG: 775 | Disposition: A | Discharge status: Home / Self Care | Payer: 59 | Source: Ambulatory Visit | Attending: Obstetrics & Gynecology | Admitting: Obstetrics & Gynecology

## 2015-01-04 DIAGNOSIS — O9989 Other specified diseases and conditions complicating pregnancy, childbirth and the puerperium: Secondary | ICD-10-CM | POA: Diagnosis present

## 2015-01-04 DIAGNOSIS — Z23 Encounter for immunization: Secondary | ICD-10-CM

## 2015-01-04 DIAGNOSIS — O365930 Maternal care for other known or suspected poor fetal growth, third trimester, not applicable or unspecified: Secondary | ICD-10-CM | POA: Diagnosis present

## 2015-01-04 DIAGNOSIS — IMO0002 Reserved for concepts with insufficient information to code with codable children: Secondary | ICD-10-CM | POA: Diagnosis present

## 2015-01-04 DIAGNOSIS — N2589 Other disorders resulting from impaired renal tubular function: Secondary | ICD-10-CM | POA: Diagnosis present

## 2015-01-04 DIAGNOSIS — Z3A37 37 weeks gestation of pregnancy: Secondary | ICD-10-CM | POA: Diagnosis present

## 2015-01-04 DIAGNOSIS — M3500 Sicca syndrome, unspecified: Secondary | ICD-10-CM | POA: Diagnosis present

## 2015-01-04 DIAGNOSIS — M329 Systemic lupus erythematosus, unspecified: Secondary | ICD-10-CM | POA: Diagnosis present

## 2015-01-04 LAB — CBC
HCT: 39.1 % (ref 36.0–46.0)
Hemoglobin: 13.3 g/dL (ref 12.0–15.0)
MCH: 29.6 pg (ref 26.0–34.0)
MCHC: 34 g/dL (ref 30.0–36.0)
MCV: 86.9 fL (ref 78.0–100.0)
PLATELETS: 206 10*3/uL (ref 150–400)
RBC: 4.5 MIL/uL (ref 3.87–5.11)
RDW: 14.1 % (ref 11.5–15.5)
WBC: 9 10*3/uL (ref 4.0–10.5)

## 2015-01-04 LAB — TYPE AND SCREEN
ABO/RH(D): A POS
Antibody Screen: NEGATIVE

## 2015-01-04 MED ORDER — LACTATED RINGERS IV SOLN: 500.0000 mL | INTRAVENOUS | Status: DC | PRN

## 2015-01-04 MED ORDER — CITRIC ACID-SODIUM CITRATE 334-500 MG/5ML PO SOLN: 30.0000 mL | ORAL | Status: DC | PRN

## 2015-01-04 MED ORDER — OXYCODONE-ACETAMINOPHEN 5-325 MG PO TABS
2.0000 | ORAL_TABLET | ORAL | Status: DC | PRN
Start: 2015-01-04 — End: 2015-01-05

## 2015-01-04 MED ORDER — TERBUTALINE SULFATE 1 MG/ML IJ SOLN: 0.2500 mg | Freq: Once | INTRAMUSCULAR | Status: AC | PRN

## 2015-01-04 MED ORDER — SODIUM CHLORIDE 0.9 % IJ SOLN: 3.0000 mL | INTRAMUSCULAR | Status: DC | PRN

## 2015-01-04 MED ORDER — ZOLPIDEM TARTRATE 5 MG PO TABS
5.0000 mg | ORAL_TABLET | Freq: Every evening | ORAL | Status: DC | PRN
  Administered 2015-01-04: 5 mg via ORAL
  Filled 2015-01-04: qty 1

## 2015-01-04 MED ORDER — PROMETHAZINE HCL 25 MG/ML IJ SOLN: 12.5000 mg | INTRAMUSCULAR | Status: DC | PRN

## 2015-01-04 MED ORDER — FLEET ENEMA 7-19 GM/118ML RE ENEM: 1.0000 | ENEMA | RECTAL | Status: DC | PRN

## 2015-01-04 MED ORDER — OXYTOCIN 40 UNITS IN LACTATED RINGERS INFUSION - SIMPLE MED: 62.5000 mL/h | INTRAVENOUS | Status: DC

## 2015-01-04 MED ORDER — OXYTOCIN BOLUS FROM INFUSION: 500.0000 mL | INTRAVENOUS | Status: DC

## 2015-01-04 MED ORDER — MISOPROSTOL 25 MCG QUARTER TABLET
25.0000 ug | ORAL_TABLET | ORAL | Status: DC | PRN
  Administered 2015-01-04 – 2015-01-05 (×2): 25 ug via VAGINAL
  Filled 2015-01-04 (×2): qty 0.25

## 2015-01-04 MED ORDER — ACETAMINOPHEN 325 MG PO TABS: 650.0000 mg | ORAL_TABLET | ORAL | Status: DC | PRN

## 2015-01-04 MED ORDER — BUTORPHANOL TARTRATE 2 MG/ML IJ SOLN: 2.0000 mg | INTRAMUSCULAR | Status: DC | PRN

## 2015-01-04 MED ORDER — ONDANSETRON HCL 4 MG/2ML IJ SOLN: 4.0000 mg | Freq: Four times a day (QID) | INTRAMUSCULAR | Status: DC | PRN

## 2015-01-04 MED ORDER — LACTATED RINGERS IV SOLN
INTRAVENOUS | Status: DC
  Administered 2015-01-04 – 2015-01-05 (×2): via INTRAVENOUS

## 2015-01-04 MED ORDER — LIDOCAINE HCL (PF) 1 % IJ SOLN: 30.0000 mL | INTRAMUSCULAR | Status: DC | PRN

## 2015-01-04 MED ORDER — OXYCODONE-ACETAMINOPHEN 5-325 MG PO TABS
1.0000 | ORAL_TABLET | ORAL | Status: DC | PRN
Start: 2015-01-04 — End: 2015-01-05

## 2015-01-04 NOTE — Telephone Encounter (Signed)
Induction approved by Dr Shawnie PonsPratt

## 2015-01-04 NOTE — H&P (Signed)
NAMGlean Davis:  Davis Davis            ACCOUNT NO.:  [id number]  MEDICAL RECORD NO.:  [id number]  LOCATION:                                 FACILITY:  PHYSICIAN:  Duke Salviaichard M. Marcelle OverlieHolland, M.D.DATE OF BIRTH:  04/03/1981  DATE OF ADMISSION:  01/04/2015 DATE OF DISCHARGE:                             HISTORY & PHYSICAL   CHIEF COMPLAINT:  Labor induction at term, history of lupus with positive SSA/SSB antibodies.  HISTORY OF PRESENT ILLNESS:  A 34 year old, G1, P0, EDD 01/24/2015, presents at 37 weeks for 2 stage labor induction.  This patient has a history of lupus/Sjogren disease and also renal tubular acidosis.  She has been followed most recently by [facility] Dawayne CirriMegan Clowse [facility].  She has been on Plaquenil to try to reduce the incidence of fetal congenital heart block based on her lupus.  Panorama screening was normal on pregnancy.  Her continued followup for fetal growth and fetal arrhythmias during this pregnancy has remained normal.  Last ultrasound dated 12/28/2014, vertex, EFW was borderline at the 10th percentile with BPP 8/8 and normal Doppler studies.  1-hour GTT was normal at 83.  Blood type is A positive.  PAST MEDICAL HISTORY:  Please see the [facility] form for details.  PHYSICAL EXAMINATION:  VITAL SIGNS:  Temp 98.2, blood pressure 120/78.  HEENT:  Unremarkable.  NECK:  Supple without masses.  LUNGS:  Clear.  CARDIOVASCULAR:  Regular rate and rhythm without murmurs, rubs, or gallops noted.  BREASTS:  Revealed implants.  No other abnormalities.  ABDOMEN:  Term fundal height.  Fetal heart rate was 140.  Cervix was closed.  Vertex presenting.  EXTREMITIES:  Unremarkable.  NEUROLOGIC:  Unremarkable.  IMPRESSION: 1. Thirty-seven week intrauterine pregnancy. 2. History of lupus/Sjogren disease with positive SSA/SSB antibodies.     Borderline fetal growth at 10% by most recent ultrasound.  PLAN:  Two-stage labor induction.  The protocol was reviewed with  her, which she understands and accepts.     Sherril Heyward M. Marcelle OverlieHolland, M.D.     RMH/MEDQ  D:  01/04/2015  T:  01/04/2015  Job:  [id number]

## 2015-01-04 NOTE — Telephone Encounter (Signed)
Preadmission screen  

## 2015-01-05 ENCOUNTER — Inpatient Hospital Stay (HOSPITAL_COMMUNITY): Payer: 59 | Admitting: Anesthesiology

## 2015-01-05 LAB — ABO/RH: ABO/RH(D): A POS

## 2015-01-05 LAB — RPR: RPR Ser Ql: NONREACTIVE

## 2015-01-05 MED ORDER — OXYCODONE-ACETAMINOPHEN 5-325 MG PO TABS: 1.0000 | ORAL_TABLET | ORAL | Status: DC | PRN

## 2015-01-05 MED ORDER — OXYTOCIN BOLUS FROM INFUSION: 500.0000 mL | INTRAVENOUS | Status: DC

## 2015-01-05 MED ORDER — PENICILLIN G POTASSIUM 5000000 UNITS IJ SOLR
2.5000 10*6.[IU] | INTRAVENOUS | Status: DC
  Administered 2015-01-06 (×2): 2.5 10*6.[IU] via INTRAVENOUS
  Filled 2015-01-05 (×7): qty 2.5

## 2015-01-05 MED ORDER — EPHEDRINE 5 MG/ML INJ
10.0000 mg | INTRAVENOUS | Status: DC | PRN
  Administered 2015-01-05: 15 mg via INTRAVENOUS
  Filled 2015-01-05: qty 2

## 2015-01-05 MED ORDER — LIDOCAINE HCL (PF) 1 % IJ SOLN
30.0000 mL | INTRAMUSCULAR | Status: DC | PRN
  Filled 2015-01-05: qty 30

## 2015-01-05 MED ORDER — OXYCODONE-ACETAMINOPHEN 5-325 MG PO TABS: 2.0000 | ORAL_TABLET | ORAL | Status: DC | PRN

## 2015-01-05 MED ORDER — OXYTOCIN 40 UNITS IN LACTATED RINGERS INFUSION - SIMPLE MED: 62.5000 mL/h | INTRAVENOUS | Status: DC

## 2015-01-05 MED ORDER — PENICILLIN G POTASSIUM 5000000 UNITS IJ SOLR
5.0000 10*6.[IU] | Freq: Once | INTRAVENOUS | Status: AC
  Administered 2015-01-06: 5 10*6.[IU] via INTRAVENOUS
  Filled 2015-01-05: qty 5

## 2015-01-05 MED ORDER — FLEET ENEMA 7-19 GM/118ML RE ENEM: 1.0000 | ENEMA | RECTAL | Status: DC | PRN

## 2015-01-05 MED ORDER — LACTATED RINGERS IV SOLN
INTRAVENOUS | Status: DC
  Administered 2015-01-06 (×2): via INTRAVENOUS

## 2015-01-05 MED ORDER — ONDANSETRON HCL 4 MG/2ML IJ SOLN: 4.0000 mg | Freq: Four times a day (QID) | INTRAMUSCULAR | Status: DC | PRN

## 2015-01-05 MED ORDER — CITRIC ACID-SODIUM CITRATE 334-500 MG/5ML PO SOLN: 30.0000 mL | ORAL | Status: DC | PRN

## 2015-01-05 MED ORDER — LACTATED RINGERS IV SOLN: 500.0000 mL | INTRAVENOUS | Status: DC | PRN

## 2015-01-05 MED ORDER — ACETAMINOPHEN 325 MG PO TABS: 650.0000 mg | ORAL_TABLET | ORAL | Status: DC | PRN

## 2015-01-05 MED ORDER — PHENYLEPHRINE 40 MCG/ML (10ML) SYRINGE FOR IV PUSH (FOR BLOOD PRESSURE SUPPORT)
80.0000 ug | PREFILLED_SYRINGE | INTRAVENOUS | Status: AC | PRN
  Administered 2015-01-05 (×3): 80 ug via INTRAVENOUS
  Filled 2015-01-05: qty 20

## 2015-01-05 MED ORDER — LIDOCAINE HCL (PF) 1 % IJ SOLN
INTRAMUSCULAR | Status: DC | PRN
  Administered 2015-01-05 (×2): 4 mL

## 2015-01-05 MED ORDER — FENTANYL 2.5 MCG/ML BUPIVACAINE 1/10 % EPIDURAL INFUSION (WH - ANES)
14.0000 mL/h | INTRAMUSCULAR | Status: DC | PRN
Start: 2015-01-05 — End: 2015-01-06
  Administered 2015-01-05 – 2015-01-06 (×3): 14 mL/h via EPIDURAL
  Filled 2015-01-05 (×3): qty 125

## 2015-01-05 MED ORDER — DIPHENHYDRAMINE HCL 50 MG/ML IJ SOLN: 12.5000 mg | INTRAMUSCULAR | Status: DC | PRN

## 2015-01-05 MED ORDER — OXYTOCIN 40 UNITS IN LACTATED RINGERS INFUSION - SIMPLE MED
1.0000 m[IU]/min | INTRAVENOUS | Status: DC
  Administered 2015-01-05: 2 m[IU]/min via INTRAVENOUS
  Filled 2015-01-05: qty 1000

## 2015-01-05 NOTE — Anesthesia Preprocedure Evaluation (Signed)
Anesthesia Evaluation  Patient identified by MRN, date of birth, ID band Patient awake    Reviewed: Allergy & Precautions, NPO status , Patient's Chart, lab work & pertinent test results  History of Anesthesia Complications Negative for: history of anesthetic complications  Airway Mallampati: II  TM Distance: >3 FB Neck ROM: Full    Dental no notable dental hx. (+) Dental Advisory Given   Pulmonary asthma (childhood, no longer carries this diagnosis) , former smoker,  breath sounds clear to auscultation  Pulmonary exam normal       Cardiovascular negative cardio ROS Normal cardiovascular examRhythm:Regular Rate:Normal     Neuro/Psych negative neurological ROS  negative psych ROS   GI/Hepatic Neg liver ROS, GERD-  Medicated and Controlled,  Endo/Other  negative endocrine ROSLupus and sjogrens, oral and occular involvement, denies cardiac issues, denies being on steroids, reports stable disease process   Renal/GU Renal disease  negative genitourinary   Musculoskeletal negative musculoskeletal ROS (+)   Abdominal   Peds negative pediatric ROS (+)  Hematology negative hematology ROS (+)   Anesthesia Other Findings   Reproductive/Obstetrics (+) Pregnancy                             Anesthesia Physical Anesthesia Plan  ASA: III  Anesthesia Plan: Epidural   Post-op Pain Management:    Induction:   Airway Management Planned:   Additional Equipment:   Intra-op Plan:   Post-operative Plan:   Informed Consent: I have reviewed the patients History and Physical, chart, labs and discussed the procedure including the risks, benefits and alternatives for the proposed anesthesia with the patient or authorized representative who has indicated his/her understanding and acceptance.   Dental advisory given  Plan Discussed with:   Anesthesia Plan Comments:         Anesthesia Quick  Evaluation

## 2015-01-05 NOTE — Progress Notes (Signed)
Admission nutrition screen triggered for unintentional weight loss > 10 lbs within the last month.PNR indicates a total weight gain of 26 lbs.   Patients chart reviewed and assessed  for nutritional risk. Patient is determined to be at low nutrition  risk.   Elisabeth CaraKatherine Kearney Evitt M.Odis LusterEd. R.D. LDN Neonatal Nutrition Support Specialist/RD III Pager [phone]

## 2015-01-05 NOTE — Progress Notes (Signed)
cx tight FT on 20 miu/min pit with stable FHR, discussed option of rest>>cytotec vs cont with pit protocol. She is getting more uncomt with ctx and would prefer to cont w/ pit, and go ahead w/ epidural

## 2015-01-05 NOTE — Progress Notes (Addendum)
Dr Arby BarretteHatchett made aware of patient's decreased blood pressures and symptoms also made aware of medications given; ordered to stop epidural for one hour and give 3ml ephedrine at this time

## 2015-01-05 NOTE — Anesthesia Procedure Notes (Signed)
Epidural Patient location during procedure: OB Start time: 01/05/2015 5:34 PM  Staffing Anesthesiologist: Karie SchwalbeJUDD, Karlin Heilman Performed by: anesthesiologist   Preanesthetic Checklist Completed: patient identified, site marked, surgical consent, pre-op evaluation, timeout performed, IV checked, risks and benefits discussed and monitors and equipment checked  Epidural Patient position: sitting Prep: site prepped and draped and DuraPrep Patient monitoring: continuous pulse ox and blood pressure Approach: midline Location: L3-L4 Injection technique: LOR saline  Needle:  Needle type: Tuohy  Needle gauge: 17 G Needle length: 9 cm and 9 Needle insertion depth: 5 cm cm Catheter type: closed end flexible Catheter size: 19 Gauge Catheter at skin depth: 9 cm Test dose: negative  Assessment Events: blood not aspirated, injection not painful, no injection resistance, negative IV test and paresthesia (Reported a R sided paresthesia in R buttock when threading catheter after 10cm in epidural space, this  resolved immediately, repositioned catheter to 4cm in space and no paresthesia present, injected and no paresthesia present)  Additional Notes Patient identified. Risks/Benefits/Options discussed with patient including but not limited to bleeding, infection, nerve damage, paralysis, failed block, incomplete pain control, headache, blood pressure changes, nausea, vomiting, reactions to medication both or allergic, itching and postpartum back pain. Confirmed with bedside nurse the patient's most recent platelet count. Confirmed with patient that they are not currently taking any anticoagulation, have any bleeding history or any family history of bleeding disorders. Patient expressed understanding and wished to proceed. All questions were answered. Sterile technique was used throughout the entire procedure. Please see nursing notes for vital signs. Test dose was given through epidural catheter and negative  prior to continuing to dose epidural or start infusion. Warning signs of high block given to the patient including shortness of breath, tingling/numbness in hands, complete motor block, or any concerning symptoms with instructions to call for help. Patient was given instructions on fall risk and not to get out of bed. All questions and concerns addressed with instructions to call with any issues or inadequate analgesia.

## 2015-01-05 NOTE — Progress Notes (Signed)
FHR stable after D/C pit and ephedrine, epid now off AROM>>>copious clear AF, IUPC placed>>cx 1/25/vtx, stable FHR Will restart pit per protocol

## 2015-01-06 ENCOUNTER — Encounter (HOSPITAL_COMMUNITY): Payer: Self-pay

## 2015-01-06 DIAGNOSIS — IMO0002 Reserved for concepts with insufficient information to code with codable children: Secondary | ICD-10-CM | POA: Diagnosis present

## 2015-01-06 MED ORDER — WITCH HAZEL-GLYCERIN EX PADS: 1.0000 "application " | MEDICATED_PAD | CUTANEOUS | Status: DC | PRN

## 2015-01-06 MED ORDER — DIPHENHYDRAMINE HCL 25 MG PO CAPS: 25.0000 mg | ORAL_CAPSULE | Freq: Four times a day (QID) | ORAL | Status: DC | PRN

## 2015-01-06 MED ORDER — SENNOSIDES-DOCUSATE SODIUM 8.6-50 MG PO TABS
2.0000 | ORAL_TABLET | ORAL | Status: DC
  Administered 2015-01-06 – 2015-01-07 (×2): 2 via ORAL
  Filled 2015-01-06 (×2): qty 2

## 2015-01-06 MED ORDER — CHLOROPROCAINE HCL 1 % IJ SOLN
INTRAMUSCULAR | Status: AC
  Filled 2015-01-06: qty 30

## 2015-01-06 MED ORDER — ZOLPIDEM TARTRATE 5 MG PO TABS: 5.0000 mg | ORAL_TABLET | Freq: Every evening | ORAL | Status: DC | PRN

## 2015-01-06 MED ORDER — LANOLIN HYDROUS EX OINT: TOPICAL_OINTMENT | CUTANEOUS | Status: DC | PRN

## 2015-01-06 MED ORDER — ONDANSETRON HCL 4 MG PO TABS: 4.0000 mg | ORAL_TABLET | ORAL | Status: DC | PRN

## 2015-01-06 MED ORDER — SIMETHICONE 80 MG PO CHEW: 80.0000 mg | CHEWABLE_TABLET | ORAL | Status: DC | PRN

## 2015-01-06 MED ORDER — HYDROXYCHLOROQUINE SULFATE 200 MG PO TABS
300.0000 mg | ORAL_TABLET | Freq: Every day | ORAL | Status: DC
  Administered 2015-01-06: 300 mg via ORAL
  Filled 2015-01-06 (×2): qty 1.5

## 2015-01-06 MED ORDER — OXYCODONE-ACETAMINOPHEN 5-325 MG PO TABS: 2.0000 | ORAL_TABLET | ORAL | Status: DC | PRN

## 2015-01-06 MED ORDER — TETANUS-DIPHTH-ACELL PERTUSSIS 5-2.5-18.5 LF-MCG/0.5 IM SUSP: 0.5000 mL | Freq: Once | INTRAMUSCULAR | Status: DC

## 2015-01-06 MED ORDER — DIBUCAINE 1 % RE OINT: 1.0000 "application " | TOPICAL_OINTMENT | RECTAL | Status: DC | PRN

## 2015-01-06 MED ORDER — BENZOCAINE-MENTHOL 20-0.5 % EX AERO
1.0000 "application " | INHALATION_SPRAY | CUTANEOUS | Status: DC | PRN
  Filled 2015-01-06: qty 56

## 2015-01-06 MED ORDER — ONDANSETRON HCL 4 MG/2ML IJ SOLN: 4.0000 mg | INTRAMUSCULAR | Status: DC | PRN

## 2015-01-06 MED ORDER — PRENATAL MULTIVITAMIN CH
1.0000 | ORAL_TABLET | Freq: Every day | ORAL | Status: DC
Start: 2015-01-06 — End: 2015-01-08

## 2015-01-06 MED ORDER — ACETAMINOPHEN 325 MG PO TABS: 650.0000 mg | ORAL_TABLET | ORAL | Status: DC | PRN

## 2015-01-06 MED ORDER — OXYCODONE-ACETAMINOPHEN 5-325 MG PO TABS
1.0000 | ORAL_TABLET | ORAL | Status: DC | PRN
  Administered 2015-01-06: 1 via ORAL
  Filled 2015-01-06: qty 1

## 2015-01-06 MED ORDER — IBUPROFEN 600 MG PO TABS
600.0000 mg | ORAL_TABLET | Freq: Four times a day (QID) | ORAL | Status: DC
  Administered 2015-01-06 – 2015-01-08 (×7): 600 mg via ORAL
  Filled 2015-01-06 (×7): qty 1

## 2015-01-06 NOTE — Progress Notes (Signed)
[name] HessJennifer Brew is a 34 y.o. G1P0 at 9041w3d by ultrasound admitted for induction of labor due to IUGR, SLE.  Subjective: No c/o.  Comfortable with epidural.  Objective: BP 128/74 mmHg  Pulse 89  Temp(Src) 98.2 F (36.8 C) (Oral)  Resp 18  Ht 5\' 4"  (1.626 m)  Wt 143 lb (64.864 kg)  BMI 24.53 kg/m2  SpO2 100%  LMP 04/19/2014 I/O last 3 completed shifts: In: -  Out: 7400 [Urine:7400]    FHT:  FHR: 140 bpm, variability: moderate,  accelerations:  Present,  decelerations:  Absent UC:   regular, every 3 minutes SVE:   Dilation: 1 Effacement (%): 100 Station: +1 Exam by:: n licato rn SVE 4-5/90/-1  Labs: Lab Results  Component Value Date   WBC 9.0 01/04/2015   HGB 13.3 01/04/2015   HCT 39.1 01/04/2015   MCV 86.9 01/04/2015   PLT 206 01/04/2015    Assessment / Plan: Induction of labor due to IUGR,  progressing well on pitocin  Labor: Progressing normally Preeclampsia:  n/a Fetal Wellbeing:  Category I Pain Control:  Epidural I/D:  n/a Anticipated MOD:  NSVD  Cornelious Bartolucci 01/06/2015, 8:07 AM

## 2015-01-06 NOTE — Anesthesia Postprocedure Evaluation (Signed)
Anesthesia Post Note  Patient: [name] HessJennifer Boyar  Procedure(s) Performed: * No procedures listed *  Anesthesia type: Epidural  Patient location: Mother/Baby  Post pain: Pain level controlled  Post assessment: Post-op Vital signs reviewed  Last Vitals:  Filed Vitals:   01/06/15 1215  BP: 116/78  Pulse: 73  Temp:   Resp:     Post vital signs: Reviewed  Level of consciousness:alert  Complications: No apparent anesthesia complications

## 2015-01-06 NOTE — Lactation Note (Signed)
This note was copied from the chart of [name] Glean HessJennifer Lindstrom. Lactation Consultation Note Initial visit at 9 hours of age.  Mom reports baby has been sleepy and last breastfed about 5 hours ago.  Baby asleep in visitors arms. Encouraged mom to undress baby for STS.  Mom demonstrated hand expression with colostrum visible.  Baby latched with assist for a few sucks and held nipple in mouth, but didn't maintain sucking.  Baby continues to lay on mom STS.  Discussed basic newborn care and basics of breastfeeding.  FOB supportive at bedside asking many questions.  All questions answered.  [facility] resources given and discussed.  Encouraged to feed with early cues on demand.  Early newborn behavior discussed.  Mom to call for assist as needed.    Patient Name: [name] Davis MWUXL'KToday's Date: 01/06/2015 Reason for consult: Initial assessment   Maternal Data Has patient been taught Hand Expression?: Yes Does the patient have breastfeeding experience prior to this delivery?: No  Feeding Feeding Type: Breast Fed Length of feed: 1 min  LATCH Score/Interventions Latch: Repeated attempts needed to sustain latch, nipple held in mouth throughout feeding, stimulation needed to elicit sucking reflex. Intervention(s): Assist with latch;Breast massage;Breast compression;Adjust position  Audible Swallowing: None Intervention(s): Hand expression;Skin to skin  Type of Nipple: Everted at rest and after stimulation  Comfort (Breast/Nipple): Soft / non-tender     Hold (Positioning): Assistance needed to correctly position infant at breast and maintain latch. Intervention(s): Breastfeeding basics reviewed;Support Pillows;Position options;Skin to skin  LATCH Score: 6  Lactation Tools Discussed/Used     Consult Status Consult Status: Follow-up Date: 01/07/15 Follow-up type: In-patient    [name], [name] 01/06/2015, 8:26 PM

## 2015-01-07 LAB — CBC
HCT: 35.4 % — ABNORMAL LOW (ref 36.0–46.0)
Hemoglobin: 11.7 g/dL — ABNORMAL LOW (ref 12.0–15.0)
MCH: 28.7 pg (ref 26.0–34.0)
MCHC: 33.1 g/dL (ref 30.0–36.0)
MCV: 87 fL (ref 78.0–100.0)
PLATELETS: 161 10*3/uL (ref 150–400)
RBC: 4.07 MIL/uL (ref 3.87–5.11)
RDW: 14.2 % (ref 11.5–15.5)
WBC: 13 10*3/uL — ABNORMAL HIGH (ref 4.0–10.5)

## 2015-01-07 NOTE — Lactation Note (Signed)
This note was copied from the chart of [name] Glean HessJennifer Torrance. Lactation Consultation Note; Mom had baby latched to the breast when I went into room. Mom reports feeling tugging but no pain. Reviewed wide open mouth and getting the baby deep onto the breast. Mom reports she is able to hand express Colostrum No questions at present, To call for assist prn  Patient Name: [name] Davis ZOXWR'UToday's Date: 01/07/2015 Reason for consult: Follow-up assessment   Maternal Data Formula Feeding for Exclusion: No  Feeding Feeding Type: Breast Fed Length of feed: 20 min  LATCH Score/Interventions Latch: Grasps breast easily, tongue down, lips flanged, rhythmical sucking.  Audible Swallowing: A few with stimulation  Type of Nipple: Everted at rest and after stimulation  Comfort (Breast/Nipple): Soft / non-tender     Hold (Positioning): No assistance needed to correctly position infant at breast. Intervention(s): Breastfeeding basics reviewed  LATCH Score: 9  Lactation Tools Discussed/Used     Consult Status Consult Status: Follow-up Date: 01/08/15 Follow-up type: In-patient    Pamelia HoitWeeks, Jaymie Mckiddy D 01/07/2015, 2:21 PM

## 2015-01-07 NOTE — Progress Notes (Signed)
Post Partum Day 1 Subjective: up ad lib, voiding, tolerating PO, + flatus and c/o perineal discomfort and pressure  Objective: Blood pressure 106/62, pulse 65, temperature 98 F (36.7 C), temperature source Oral, resp. rate 16, height 5\' 4"  (1.626 m), weight 143 lb (64.864 kg), last menstrual period 04/19/2014, SpO2 100 %, unknown if currently breastfeeding.  Physical Exam:  General: alert and cooperative Lochia: appropriate Uterine Fundus: firm Incision: healing well, no ecchymosis noted, no significant edema DVT Evaluation: No evidence of DVT seen on physical exam. Negative Homan's sign. No cords or calf tenderness. No significant calf/ankle edema.   Recent Labs  01/04/15 2055 01/07/15 0552  HGB 13.3 11.7*  HCT 39.1 35.4*    Assessment/Plan: Plan for discharge tomorrow, derma plast spray and topical anesthetic   LOS: 3 days   [name] Davis G 01/07/2015, 7:56 AM

## 2015-01-08 ENCOUNTER — Ambulatory Visit: Payer: Self-pay

## 2015-01-08 MED ORDER — OXYCODONE-ACETAMINOPHEN 5-325 MG PO TABS: 1.0000 | ORAL_TABLET | ORAL | Status: AC | PRN

## 2015-01-08 MED ORDER — IBUPROFEN 600 MG PO TABS: 600.0000 mg | ORAL_TABLET | Freq: Four times a day (QID) | ORAL | Status: DC

## 2015-01-08 MED ORDER — MEASLES, MUMPS & RUBELLA VAC ~~LOC~~ INJ
0.5000 mL | INJECTION | Freq: Once | SUBCUTANEOUS | Status: AC
  Administered 2015-01-08: 0.5 mL via SUBCUTANEOUS
  Filled 2015-01-08: qty 0.5

## 2015-01-08 NOTE — Lactation Note (Incomplete)
This note was copied from the chart of [name] Glean HessJennifer Lineback. Lactation Consultation Note: Mother request assistance with positioning infant in cross cradle hold. Infant is 37.3 weeks, at 8 % weight loss. Mother was advised to hand express colostrum and supplement infant with post pump with hand pump or DEBP   Patient Name: [name] Davis ZOXWR'UToday's Date: 01/08/2015 Reason for consult: Follow-up assessment   Maternal Data    Feeding Feeding Type: Breast Fed Length of feed: 15 min  LATCH Score/Interventions Latch: Grasps breast easily, tongue down, lips flanged, rhythmical sucking.  Audible Swallowing: A few with stimulation  Type of Nipple: Everted at rest and after stimulation  Comfort (Breast/Nipple): Filling, red/small blisters or bruises, mild/mod discomfort     Hold (Positioning): Assistance needed to correctly position infant at breast and maintain latch. Intervention(s): Support Pillows;Position options  LATCH Score: 7  Lactation Tools Discussed/Used     Consult Status Consult Status: Complete    Michel BickersKendrick, Zelene Barga McCoy 01/08/2015, 12:28 PM

## 2015-01-08 NOTE — Discharge Summary (Signed)
Obstetric Discharge Summary Reason for Admission: induction of labor Prenatal Procedures: ultrasound Intrapartum Procedures: spontaneous vaginal delivery Postpartum Procedures: none Complications-Operative and Postpartum: 2 degree perineal laceration HEMOGLOBIN  Date Value Ref Range Status  01/07/2015 11.7* 12.0 - 15.0 g/dL Final   HCT  Date Value Ref Range Status  01/07/2015 35.4* 36.0 - 46.0 % Final    Physical Exam:  General: alert and cooperative Lochia: appropriate Uterine Fundus: firm Incision: healing well DVT Evaluation: No evidence of DVT seen on physical exam. Negative Homan's sign. No cords or calf tenderness. No significant calf/ankle edema.  Discharge Diagnoses: Term Pregnancy-delivered  Discharge Information: Date: 01/08/2015 Activity: pelvic rest Diet: routine Medications: PNV, Ibuprofen and Percocet Condition: stable Instructions: refer to practice specific booklet Discharge to: home   Newborn Data: Live born female  Birth Weight: 5 lb 10.3 oz (2560 g) APGAR: 8, 9 Baby bilirubin is elevated, may undergo phototherapy , awaiting labs.  [name] Davis G 01/08/2015, 8:58 AM

## 2015-01-24 ENCOUNTER — Inpatient Hospital Stay (HOSPITAL_COMMUNITY): Admission: AD | Admit: 2015-01-24 | Payer: Self-pay | Source: Ambulatory Visit | Admitting: Obstetrics and Gynecology

## 2015-02-10 ENCOUNTER — Emergency Department (HOSPITAL_COMMUNITY)
Admission: EM | Admit: 2015-02-10 | Discharge: 2015-02-10 | Disposition: A | Discharge status: Home / Self Care | Payer: 59 | Attending: Emergency Medicine | Admitting: Emergency Medicine

## 2015-02-10 ENCOUNTER — Other Ambulatory Visit: Payer: Self-pay | Admitting: Nephrology

## 2015-02-10 ENCOUNTER — Emergency Department (HOSPITAL_COMMUNITY): Payer: 59

## 2015-02-10 ENCOUNTER — Encounter (HOSPITAL_COMMUNITY): Payer: Self-pay | Admitting: *Deleted

## 2015-02-10 DIAGNOSIS — O9989 Other specified diseases and conditions complicating pregnancy, childbirth and the puerperium: Secondary | ICD-10-CM | POA: Insufficient documentation

## 2015-02-10 DIAGNOSIS — N201 Calculus of ureter: Secondary | ICD-10-CM | POA: Diagnosis not present

## 2015-02-10 DIAGNOSIS — Z87891 Personal history of nicotine dependence: Secondary | ICD-10-CM | POA: Diagnosis not present

## 2015-02-10 DIAGNOSIS — Z8619 Personal history of other infectious and parasitic diseases: Secondary | ICD-10-CM | POA: Insufficient documentation

## 2015-02-10 DIAGNOSIS — E876 Hypokalemia: Secondary | ICD-10-CM | POA: Insufficient documentation

## 2015-02-10 DIAGNOSIS — N200 Calculus of kidney: Secondary | ICD-10-CM

## 2015-02-10 DIAGNOSIS — J45909 Unspecified asthma, uncomplicated: Secondary | ICD-10-CM | POA: Insufficient documentation

## 2015-02-10 DIAGNOSIS — Z8739 Personal history of other diseases of the musculoskeletal system and connective tissue: Secondary | ICD-10-CM | POA: Diagnosis not present

## 2015-02-10 DIAGNOSIS — O99285 Endocrine, nutritional and metabolic diseases complicating the puerperium: Secondary | ICD-10-CM | POA: Diagnosis not present

## 2015-02-10 DIAGNOSIS — Z8719 Personal history of other diseases of the digestive system: Secondary | ICD-10-CM | POA: Diagnosis not present

## 2015-02-10 DIAGNOSIS — N29 Other disorders of kidney and ureter in diseases classified elsewhere: Secondary | ICD-10-CM

## 2015-02-10 DIAGNOSIS — O9953 Diseases of the respiratory system complicating the puerperium: Secondary | ICD-10-CM | POA: Diagnosis not present

## 2015-02-10 LAB — URINALYSIS, ROUTINE W REFLEX MICROSCOPIC
Bilirubin Urine: NEGATIVE
GLUCOSE, UA: NEGATIVE mg/dL
Ketones, ur: NEGATIVE mg/dL
Nitrite: NEGATIVE
PH: 7.5 (ref 5.0–8.0)
Protein, ur: NEGATIVE mg/dL
Specific Gravity, Urine: 1.004 — ABNORMAL LOW (ref 1.005–1.030)
Urobilinogen, UA: 0.2 mg/dL (ref 0.0–1.0)

## 2015-02-10 LAB — COMPREHENSIVE METABOLIC PANEL
ALT: 80 U/L — AB (ref 14–54)
AST: 58 U/L — AB (ref 15–41)
Albumin: 3.6 g/dL (ref 3.5–5.0)
Alkaline Phosphatase: 88 U/L (ref 38–126)
Anion gap: 11 (ref 5–15)
BILIRUBIN TOTAL: 0.5 mg/dL (ref 0.3–1.2)
BUN: 11 mg/dL (ref 6–20)
CHLORIDE: 109 mmol/L (ref 101–111)
CO2: 16 mmol/L — AB (ref 22–32)
Calcium: 8.8 mg/dL — ABNORMAL LOW (ref 8.9–10.3)
Creatinine, Ser: 1.07 mg/dL — ABNORMAL HIGH (ref 0.44–1.00)
GFR calc Af Amer: >60 mL/min (ref >60)
GFR calc non Af Amer: >60 mL/min (ref >60)
Glucose, Bld: 119 mg/dL — ABNORMAL HIGH (ref 65–99)
Potassium: 2.8 mmol/L — ABNORMAL LOW (ref 3.5–5.1)
SODIUM: 136 mmol/L (ref 135–145)
Total Protein: 7.2 g/dL (ref 6.5–8.1)

## 2015-02-10 LAB — LIPASE, BLOOD: LIPASE: 40 U/L (ref 22–51)

## 2015-02-10 LAB — CBC WITH DIFFERENTIAL/PLATELET
BASOS ABS: 0 10*3/uL (ref 0.0–0.1)
Basophils Relative: 0 % (ref 0–1)
EOS PCT: 1 % (ref 0–5)
Eosinophils Absolute: 0.1 10*3/uL (ref 0.0–0.7)
HCT: 40.5 % (ref 36.0–46.0)
Hemoglobin: 13.7 g/dL (ref 12.0–15.0)
LYMPHS ABS: 0.8 10*3/uL (ref 0.7–4.0)
Lymphocytes Relative: 13 % (ref 12–46)
MCH: 28.6 pg (ref 26.0–34.0)
MCHC: 33.8 g/dL (ref 30.0–36.0)
MCV: 84.6 fL (ref 78.0–100.0)
MONO ABS: 0.2 10*3/uL (ref 0.1–1.0)
Monocytes Relative: 4 % (ref 3–12)
Neutro Abs: 5 10*3/uL (ref 1.7–7.7)
Neutrophils Relative %: 82 % — ABNORMAL HIGH (ref 43–77)
Platelets: 169 10*3/uL (ref 150–400)
RBC: 4.79 MIL/uL (ref 3.87–5.11)
RDW: 12.5 % (ref 11.5–15.5)
WBC: 6 10*3/uL (ref 4.0–10.5)

## 2015-02-10 LAB — URINE MICROSCOPIC-ADD ON

## 2015-02-10 MED ORDER — POTASSIUM CHLORIDE CRYS ER 20 MEQ PO TBCR: 20.0000 meq | EXTENDED_RELEASE_TABLET | Freq: Every day | ORAL | Status: AC

## 2015-02-10 MED ORDER — MORPHINE SULFATE 4 MG/ML IJ SOLN
4.0000 mg | Freq: Once | INTRAMUSCULAR | Status: AC
  Administered 2015-02-10: 4 mg via INTRAVENOUS
  Filled 2015-02-10: qty 1

## 2015-02-10 MED ORDER — TAMSULOSIN HCL 0.4 MG PO CAPS: 0.4000 mg | ORAL_CAPSULE | Freq: Every day | ORAL | Status: AC

## 2015-02-10 MED ORDER — KETOROLAC TROMETHAMINE 30 MG/ML IJ SOLN
30.0000 mg | Freq: Once | INTRAMUSCULAR | Status: AC
  Administered 2015-02-10: 30 mg via INTRAVENOUS
  Filled 2015-02-10: qty 1

## 2015-02-10 MED ORDER — POTASSIUM CHLORIDE CRYS ER 20 MEQ PO TBCR
40.0000 meq | EXTENDED_RELEASE_TABLET | Freq: Once | ORAL | Status: AC
  Administered 2015-02-10: 40 meq via ORAL
  Filled 2015-02-10: qty 2

## 2015-02-10 MED ORDER — ONDANSETRON 4 MG PO TBDP: ORAL_TABLET | ORAL | Status: DC

## 2015-02-10 MED ORDER — SODIUM CHLORIDE 0.9 % IV BOLUS (SEPSIS)
1000.0000 mL | Freq: Once | INTRAVENOUS | Status: AC
  Administered 2015-02-10: 1000 mL via INTRAVENOUS

## 2015-02-10 MED ORDER — IBUPROFEN 600 MG PO TABS: 600.0000 mg | ORAL_TABLET | Freq: Three times a day (TID) | ORAL | Status: AC | PRN

## 2015-02-10 NOTE — ED Notes (Signed)
Pt to ED c/o L sided flank pain and lower abdominal pain onset at 3am associated with nausea. Hx of kidney stones, reports pain is different. Pt is 5wks postpartum

## 2015-02-10 NOTE — ED Notes (Signed)
Patient transported to Ultrasound 

## 2015-02-10 NOTE — ED Notes (Signed)
Pt understands need to strain all urine until stone passes.

## 2015-02-10 NOTE — ED Notes (Signed)
Pt with hx of kidney stones. Woke up at 3am with L sided flank pain and lower abdominal pain. Reports increased pressure to rectum and vagina. Pain described at constant pressure, "like contractions." Pt took Percocet at 6am. Pt also had upset stomach at home which is normal with her kidney stones. Pt is postpartum x 5 weeks; denies complication with pregnancy;reports spiking fever after water broke with labor.

## 2015-02-10 NOTE — ED Provider Notes (Signed)
CSN: [id number]     Arrival date & time 02/10/15  8478 History   First MD Initiated Contact with Patient 02/10/15 [phone]     Chief Complaint  Patient presents with  . Abdominal Pain     (Consider location/radiation/quality/duration/timing/severity/associated sxs/prior Treatment) HPI 34 year old female who is 5 weeks postpartum presents with a sudden onset of left-sided abdominal pain. The pain goes into her left groin. Started around 3 AM this morning. Took a Percocet at 6 AM but no significant relief. Feels similar to kidney stones and somewhat but also feels more severe. The patient has not had any fevers or chills. No vomiting. The patient denies any back pain. No hematuria or dysuria. She had a vaginal delivery and her vaginal bleeding has already resolved. She is not currently breast-feeding.   Past Medical History  Diagnosis Date  . Lupus   . Asthma   . Sjoegren syndrome   . Vaginal Pap smear, abnormal   . HSV-1 (herpes simplex virus 1) infection     denies any vaginal lesiona  . Hx of varicella   . History of shingles   . GERD (gastroesophageal reflux disease)   . IBS (irritable bowel syndrome)   . Osteopenia    Past Surgical History  Procedure Laterality Date  . Gynecologic cryosurgery    . Breast surgery      aug  . Mouth surgery      gum grafts   Family History  Problem Relation Age of Onset  . Thyroid disease Mother   . Thyroid disease Sister   . Heart attack Maternal Grandmother   . Diabetes Maternal Grandmother   . Cancer Maternal Grandfather     multiple myeloma  . Thyroid disease Paternal Grandmother    History  Substance Use Topics  . Smoking status: Former Smoker    Types: Cigarettes  . Smokeless tobacco: Not on file  . Alcohol Use: Yes     Comment: occassional   OB History    Gravida Para Term Preterm AB TAB SAB Ectopic Multiple Living   '1 1 1      ' 0 1     Review of Systems  Constitutional: Negative for fever.  Gastrointestinal: Positive for  abdominal pain. Negative for vomiting.  Genitourinary: Positive for flank pain. Negative for dysuria, hematuria and vaginal bleeding.  Musculoskeletal: Negative for back pain.  All other systems reviewed and are negative.     Allergies  Review of patient's allergies indicates no known allergies.  Home Medications   Prior to Admission medications   Medication Sig Start Date End Date Taking? Authorizing Provider  ibuprofen (ADVIL,MOTRIN) 600 MG tablet Take 1 tablet (600 mg total) by mouth every 6 (six) hours. 01/08/15   Juanda Chance, NP  oxyCODONE-acetaminophen (PERCOCET/ROXICET) 5-325 MG per tablet Take 1 tablet by mouth every 4 (four) hours as needed (for pain scale 4-7). 01/08/15   Juanda Chance, NP  Prenatal Vit-Fe Fumarate-FA (PRENATAL MULTIVITAMIN) TABS Take 1 tablet by mouth daily at 12 noon.    Historical Provider, MD   BP 114/78 mmHg  Pulse 65  Temp(Src) 97.9 F (36.6 C) (Oral)  Resp 19  Ht '5\' 4"'  (1.626 m)  Wt 122 lb (55.339 kg)  BMI 20.93 kg/m2  SpO2 100% Physical Exam  Constitutional: She is oriented to person, place, and time. She appears well-developed and well-nourished.  HENT:  Head: Normocephalic and atraumatic.  Right Ear: External ear normal.  Left Ear: External ear normal.  Nose: Nose normal.  Eyes:  Right eye exhibits no discharge. Left eye exhibits no discharge.  Cardiovascular: Normal rate, regular rhythm and normal heart sounds.   Pulmonary/Chest: Effort normal and breath sounds normal.  Abdominal: Soft. There is tenderness (LLQ, mild LUQ tenderness).  Neurological: She is alert and oriented to person, place, and time.  Skin: Skin is warm and dry.  Nursing note and vitals reviewed.   ED Course  Procedures (including critical care time) Labs Review Labs Reviewed  COMPREHENSIVE METABOLIC PANEL - Abnormal; Notable for the following:    Potassium 2.8 (*)    CO2 16 (*)    Glucose, Bld 119 (*)    Creatinine, Ser 1.07 (*)    Calcium 8.8 (*)    AST 58  (*)    ALT 80 (*)    All other components within normal limits  CBC WITH DIFFERENTIAL/PLATELET - Abnormal; Notable for the following:    Neutrophils Relative % 82 (*)    All other components within normal limits  URINALYSIS, ROUTINE W REFLEX MICROSCOPIC (NOT AT [facility]) - Abnormal; Notable for the following:    Specific Gravity, Urine 1.004 (*)    Hgb urine dipstick LARGE (*)    Leukocytes, UA SMALL (*)    All other components within normal limits  LIPASE, BLOOD  URINE MICROSCOPIC-ADD ON    Imaging Review US Renal  02/10/2015   CLINICAL DATA:  Acute left flank pain.  History of kidney stones.  EXAM: RENAL / URINARY TRACT ULTRASOUND COMPLETE  COMPARISON:  None.  FINDINGS: Right Kidney:  Length: 12.0 cm. There is markedly increased echogenicity throughout the medullary portions of the right kidney. Findings are suggestive for nephrocalcinosis. There is no evidence for hydronephrosis.  Left Kidney:  Length: 12.3 cm. Similar to the right kidney, there is markedly increased echogenicity throughout the medullary regions of the left kidney. In addition, there is dilatation of the left renal pelvis and central renal collecting system.  Bladder:  Normal appearance of the urinary bladder.  IMPRESSION: Diffusely increased echogenicity throughout the medullary regions of both kidneys. Findings are suggestive for nephrocalcinosis.  Mild to moderate hydronephrosis in the left kidney. Consider further evaluation with CT imaging to evaluate for an obstructing lesion or stone.   Electronically Signed   By: Markus Daft M.D.   On: 02/10/2015 08:29     EKG Interpretation None      MDM   Final diagnoses:  Left ureteral stone  Nephrocalcinosis  Hypokalemia    Patient's symptoms seem most consistent with left ureteral colic. The patient is in pain but it is moderate at this time and she is comfortable appearing. The patient has nausea when the pain becomes severe but currently not nauseated. She has a history  of RTA and states she frequently gets hypokalemia. Will replace orally. I discussed the finding of nephrocalcinosis with the patient and she states that she does have "weak bones". She will call her PCP for close f/u for further eval. Calcium mildly low here but not alarming.    Sherwood Gambler, MD 02/10/15 1655

## 2015-02-12 ENCOUNTER — Other Ambulatory Visit: Payer: 59

## 2015-02-18 ENCOUNTER — Other Ambulatory Visit: Payer: Self-pay | Admitting: Obstetrics and Gynecology

## 2015-02-19 LAB — CYTOLOGY - PAP

## 2016-05-04 ENCOUNTER — Other Ambulatory Visit: Payer: Self-pay | Admitting: Otolaryngology

## 2016-05-04 DIAGNOSIS — H7101 Cholesteatoma of attic, right ear: Secondary | ICD-10-CM

## 2016-05-05 ENCOUNTER — Ambulatory Visit
Admission: RE | Admit: 2016-05-05 | Discharge: 2016-05-05 | Disposition: A | Discharge status: Home / Self Care | Payer: 59 | Source: Ambulatory Visit | Attending: Otolaryngology | Admitting: Otolaryngology

## 2016-05-05 DIAGNOSIS — H7101 Cholesteatoma of attic, right ear: Secondary | ICD-10-CM

## 2016-05-05 MED ORDER — IOPAMIDOL (ISOVUE-300) INJECTION 61%
75.0000 mL | Freq: Once | INTRAVENOUS | Status: AC | PRN
  Administered 2016-05-05: 75 mL via INTRAVENOUS

## 2016-05-16 ENCOUNTER — Other Ambulatory Visit: Payer: Self-pay | Admitting: Otolaryngology

## 2016-08-29 DIAGNOSIS — H7311 Chronic myringitis, right ear: Secondary | ICD-10-CM | POA: Diagnosis not present

## 2016-08-29 DIAGNOSIS — H73891 Other specified disorders of tympanic membrane, right ear: Secondary | ICD-10-CM | POA: Diagnosis not present

## 2016-10-02 DIAGNOSIS — H73891 Other specified disorders of tympanic membrane, right ear: Secondary | ICD-10-CM | POA: Diagnosis not present

## 2016-10-09 DIAGNOSIS — N202 Calculus of kidney with calculus of ureter: Secondary | ICD-10-CM | POA: Diagnosis not present

## 2016-10-10 ENCOUNTER — Other Ambulatory Visit: Payer: Self-pay | Admitting: Urology

## 2016-10-16 ENCOUNTER — Encounter (HOSPITAL_COMMUNITY): Admission: RE | Payer: Self-pay | Source: Ambulatory Visit

## 2016-10-16 ENCOUNTER — Ambulatory Visit (HOSPITAL_COMMUNITY): Admission: RE | Admit: 2016-10-16 | Payer: 59 | Source: Ambulatory Visit | Admitting: Urology

## 2016-10-16 SURGERY — LITHOTRIPSY, ESWL
Anesthesia: LOCAL | Laterality: Right

## 2016-10-31 DIAGNOSIS — Z23 Encounter for immunization: Secondary | ICD-10-CM | POA: Diagnosis not present

## 2016-11-01 ENCOUNTER — Ambulatory Visit: Payer: Self-pay | Admitting: Urology

## 2016-11-01 ENCOUNTER — Encounter: Payer: Self-pay | Admitting: Urology

## 2016-11-01 ENCOUNTER — Other Ambulatory Visit: Payer: Self-pay

## 2016-11-14 DIAGNOSIS — Z0184 Encounter for antibody response examination: Secondary | ICD-10-CM | POA: Diagnosis not present

## 2016-11-16 DIAGNOSIS — Z23 Encounter for immunization: Secondary | ICD-10-CM | POA: Diagnosis not present

## 2016-11-21 DIAGNOSIS — H73891 Other specified disorders of tympanic membrane, right ear: Secondary | ICD-10-CM | POA: Diagnosis not present

## 2016-11-21 DIAGNOSIS — H7311 Chronic myringitis, right ear: Secondary | ICD-10-CM | POA: Diagnosis not present

## 2016-11-21 DIAGNOSIS — H6041 Cholesteatoma of right external ear: Secondary | ICD-10-CM | POA: Diagnosis not present

## 2016-11-29 DIAGNOSIS — L700 Acne vulgaris: Secondary | ICD-10-CM | POA: Diagnosis not present

## 2016-11-29 DIAGNOSIS — L720 Epidermal cyst: Secondary | ICD-10-CM | POA: Diagnosis not present

## 2016-11-29 DIAGNOSIS — K130 Diseases of lips: Secondary | ICD-10-CM | POA: Diagnosis not present

## 2016-12-14 DIAGNOSIS — H7311 Chronic myringitis, right ear: Secondary | ICD-10-CM | POA: Diagnosis not present

## 2016-12-14 DIAGNOSIS — H73891 Other specified disorders of tympanic membrane, right ear: Secondary | ICD-10-CM | POA: Diagnosis not present

## 2016-12-14 DIAGNOSIS — H6041 Cholesteatoma of right external ear: Secondary | ICD-10-CM | POA: Diagnosis not present

## 2016-12-15 DIAGNOSIS — Z79899 Other long term (current) drug therapy: Secondary | ICD-10-CM | POA: Diagnosis not present

## 2016-12-15 DIAGNOSIS — M3509 Sicca syndrome with other organ involvement: Secondary | ICD-10-CM | POA: Diagnosis not present

## 2016-12-15 DIAGNOSIS — I7300 Raynaud's syndrome without gangrene: Secondary | ICD-10-CM | POA: Diagnosis not present

## 2016-12-21 DIAGNOSIS — Z23 Encounter for immunization: Secondary | ICD-10-CM | POA: Diagnosis not present

## 2016-12-26 DIAGNOSIS — M8588 Other specified disorders of bone density and structure, other site: Secondary | ICD-10-CM | POA: Diagnosis not present

## 2017-01-02 DIAGNOSIS — H7311 Chronic myringitis, right ear: Secondary | ICD-10-CM | POA: Diagnosis not present

## 2017-01-02 DIAGNOSIS — H6041 Cholesteatoma of right external ear: Secondary | ICD-10-CM | POA: Diagnosis not present

## 2017-01-02 DIAGNOSIS — H73891 Other specified disorders of tympanic membrane, right ear: Secondary | ICD-10-CM | POA: Diagnosis not present

## 2017-01-17 DIAGNOSIS — H73891 Other specified disorders of tympanic membrane, right ear: Secondary | ICD-10-CM | POA: Diagnosis not present

## 2017-01-17 DIAGNOSIS — H608X1 Other otitis externa, right ear: Secondary | ICD-10-CM | POA: Diagnosis not present

## 2017-01-25 DIAGNOSIS — R221 Localized swelling, mass and lump, neck: Secondary | ICD-10-CM | POA: Diagnosis not present

## 2017-01-25 DIAGNOSIS — H73891 Other specified disorders of tympanic membrane, right ear: Secondary | ICD-10-CM | POA: Diagnosis not present

## 2017-02-07 DIAGNOSIS — N2589 Other disorders resulting from impaired renal tubular function: Secondary | ICD-10-CM | POA: Diagnosis not present

## 2017-02-07 DIAGNOSIS — N200 Calculus of kidney: Secondary | ICD-10-CM | POA: Diagnosis not present

## 2017-02-21 DIAGNOSIS — H608X1 Other otitis externa, right ear: Secondary | ICD-10-CM | POA: Diagnosis not present

## 2017-02-21 DIAGNOSIS — H7311 Chronic myringitis, right ear: Secondary | ICD-10-CM | POA: Diagnosis not present

## 2017-02-21 DIAGNOSIS — H73891 Other specified disorders of tympanic membrane, right ear: Secondary | ICD-10-CM | POA: Diagnosis not present

## 2017-03-15 DIAGNOSIS — H9011 Conductive hearing loss, unilateral, right ear, with unrestricted hearing on the contralateral side: Secondary | ICD-10-CM | POA: Diagnosis not present

## 2017-03-15 DIAGNOSIS — H73891 Other specified disorders of tympanic membrane, right ear: Secondary | ICD-10-CM | POA: Diagnosis not present

## 2017-03-15 DIAGNOSIS — H608X1 Other otitis externa, right ear: Secondary | ICD-10-CM | POA: Diagnosis not present

## 2017-03-29 DIAGNOSIS — Z13220 Encounter for screening for lipoid disorders: Secondary | ICD-10-CM | POA: Diagnosis not present

## 2017-03-29 DIAGNOSIS — Z0000 Encounter for general adult medical examination without abnormal findings: Secondary | ICD-10-CM | POA: Diagnosis not present

## 2017-04-04 DIAGNOSIS — I7300 Raynaud's syndrome without gangrene: Secondary | ICD-10-CM | POA: Diagnosis not present

## 2017-04-04 DIAGNOSIS — Z79899 Other long term (current) drug therapy: Secondary | ICD-10-CM | POA: Diagnosis not present

## 2017-04-04 DIAGNOSIS — M3509 Sicca syndrome with other organ involvement: Secondary | ICD-10-CM | POA: Diagnosis not present

## 2017-05-08 DIAGNOSIS — D72819 Decreased white blood cell count, unspecified: Secondary | ICD-10-CM | POA: Diagnosis not present

## 2017-05-23 DIAGNOSIS — Z01419 Encounter for gynecological examination (general) (routine) without abnormal findings: Secondary | ICD-10-CM | POA: Diagnosis not present

## 2017-07-10 DIAGNOSIS — H608X1 Other otitis externa, right ear: Secondary | ICD-10-CM | POA: Diagnosis not present

## 2017-07-10 DIAGNOSIS — H9011 Conductive hearing loss, unilateral, right ear, with unrestricted hearing on the contralateral side: Secondary | ICD-10-CM | POA: Diagnosis not present

## 2017-07-10 DIAGNOSIS — H73891 Other specified disorders of tympanic membrane, right ear: Secondary | ICD-10-CM | POA: Diagnosis not present

## 2017-07-24 DIAGNOSIS — H04123 Dry eye syndrome of bilateral lacrimal glands: Secondary | ICD-10-CM | POA: Diagnosis not present

## 2017-07-25 DIAGNOSIS — S0501XA Injury of conjunctiva and corneal abrasion without foreign body, right eye, initial encounter: Secondary | ICD-10-CM | POA: Diagnosis not present

## 2017-07-25 DIAGNOSIS — H02052 Trichiasis without entropian right lower eyelid: Secondary | ICD-10-CM | POA: Diagnosis not present

## 2017-07-25 DIAGNOSIS — H16103 Unspecified superficial keratitis, bilateral: Secondary | ICD-10-CM | POA: Diagnosis not present

## 2017-08-03 DIAGNOSIS — H5340 Unspecified visual field defects: Secondary | ICD-10-CM | POA: Diagnosis not present

## 2017-08-03 DIAGNOSIS — H04123 Dry eye syndrome of bilateral lacrimal glands: Secondary | ICD-10-CM | POA: Diagnosis not present

## 2017-08-31 DIAGNOSIS — H04123 Dry eye syndrome of bilateral lacrimal glands: Secondary | ICD-10-CM | POA: Diagnosis not present

## 2017-09-20 DIAGNOSIS — H9011 Conductive hearing loss, unilateral, right ear, with unrestricted hearing on the contralateral side: Secondary | ICD-10-CM | POA: Diagnosis not present

## 2017-09-20 DIAGNOSIS — G4489 Other headache syndrome: Secondary | ICD-10-CM | POA: Diagnosis not present

## 2017-09-20 DIAGNOSIS — H73891 Other specified disorders of tympanic membrane, right ear: Secondary | ICD-10-CM | POA: Diagnosis not present

## 2017-10-02 ENCOUNTER — Other Ambulatory Visit: Payer: Self-pay | Admitting: Otolaryngology

## 2017-10-02 DIAGNOSIS — H9011 Conductive hearing loss, unilateral, right ear, with unrestricted hearing on the contralateral side: Secondary | ICD-10-CM

## 2017-10-02 DIAGNOSIS — G4489 Other headache syndrome: Secondary | ICD-10-CM

## 2017-10-04 DIAGNOSIS — M3509 Sicca syndrome with other organ involvement: Secondary | ICD-10-CM | POA: Diagnosis not present

## 2017-10-04 DIAGNOSIS — I7300 Raynaud's syndrome without gangrene: Secondary | ICD-10-CM | POA: Diagnosis not present

## 2017-10-04 DIAGNOSIS — Z79899 Other long term (current) drug therapy: Secondary | ICD-10-CM | POA: Diagnosis not present

## 2017-10-08 ENCOUNTER — Ambulatory Visit
Admission: RE | Admit: 2017-10-08 | Discharge: 2017-10-08 | Disposition: A | Discharge status: Home / Self Care | Payer: 59 | Source: Ambulatory Visit | Attending: Otolaryngology | Admitting: Otolaryngology

## 2017-10-08 DIAGNOSIS — H9011 Conductive hearing loss, unilateral, right ear, with unrestricted hearing on the contralateral side: Secondary | ICD-10-CM

## 2017-10-08 DIAGNOSIS — G4489 Other headache syndrome: Secondary | ICD-10-CM

## 2017-10-08 MED ORDER — GADOBENATE DIMEGLUMINE 529 MG/ML IV SOLN
9.0000 mL | Freq: Once | INTRAVENOUS | Status: AC | PRN
  Administered 2017-10-08: 9 mL via INTRAVENOUS

## 2017-11-14 DIAGNOSIS — R509 Fever, unspecified: Secondary | ICD-10-CM | POA: Diagnosis not present

## 2017-11-22 DIAGNOSIS — H73891 Other specified disorders of tympanic membrane, right ear: Secondary | ICD-10-CM | POA: Diagnosis not present

## 2017-11-22 DIAGNOSIS — G4489 Other headache syndrome: Secondary | ICD-10-CM | POA: Diagnosis not present

## 2017-11-22 DIAGNOSIS — H9011 Conductive hearing loss, unilateral, right ear, with unrestricted hearing on the contralateral side: Secondary | ICD-10-CM | POA: Diagnosis not present

## 2017-12-25 DIAGNOSIS — J310 Chronic rhinitis: Secondary | ICD-10-CM | POA: Diagnosis not present

## 2017-12-25 DIAGNOSIS — R220 Localized swelling, mass and lump, head: Secondary | ICD-10-CM | POA: Diagnosis not present

## 2017-12-25 DIAGNOSIS — J3489 Other specified disorders of nose and nasal sinuses: Secondary | ICD-10-CM | POA: Diagnosis not present

## 2017-12-25 DIAGNOSIS — J342 Deviated nasal septum: Secondary | ICD-10-CM | POA: Diagnosis not present

## 2018-03-07 DIAGNOSIS — I7300 Raynaud's syndrome without gangrene: Secondary | ICD-10-CM | POA: Diagnosis not present

## 2018-03-07 DIAGNOSIS — Z79899 Other long term (current) drug therapy: Secondary | ICD-10-CM | POA: Diagnosis not present

## 2018-03-07 DIAGNOSIS — M3509 Sicca syndrome with other organ involvement: Secondary | ICD-10-CM | POA: Diagnosis not present

## 2018-03-08 DIAGNOSIS — J3489 Other specified disorders of nose and nasal sinuses: Secondary | ICD-10-CM | POA: Diagnosis not present

## 2018-03-08 DIAGNOSIS — J432 Centrilobular emphysema: Secondary | ICD-10-CM | POA: Diagnosis not present

## 2018-03-08 DIAGNOSIS — J343 Hypertrophy of nasal turbinates: Secondary | ICD-10-CM | POA: Diagnosis not present

## 2018-03-13 DIAGNOSIS — J342 Deviated nasal septum: Secondary | ICD-10-CM | POA: Diagnosis not present

## 2018-03-13 DIAGNOSIS — H73891 Other specified disorders of tympanic membrane, right ear: Secondary | ICD-10-CM | POA: Diagnosis not present

## 2018-03-13 DIAGNOSIS — H9011 Conductive hearing loss, unilateral, right ear, with unrestricted hearing on the contralateral side: Secondary | ICD-10-CM | POA: Diagnosis not present

## 2018-03-15 DIAGNOSIS — H04123 Dry eye syndrome of bilateral lacrimal glands: Secondary | ICD-10-CM | POA: Diagnosis not present

## 2018-04-19 DIAGNOSIS — R8290 Unspecified abnormal findings in urine: Secondary | ICD-10-CM | POA: Diagnosis not present

## 2018-04-19 DIAGNOSIS — Z0000 Encounter for general adult medical examination without abnormal findings: Secondary | ICD-10-CM | POA: Diagnosis not present

## 2018-04-23 DIAGNOSIS — E538 Deficiency of other specified B group vitamins: Secondary | ICD-10-CM | POA: Diagnosis not present

## 2018-04-24 DIAGNOSIS — L818 Other specified disorders of pigmentation: Secondary | ICD-10-CM | POA: Diagnosis not present

## 2018-04-26 DIAGNOSIS — H04123 Dry eye syndrome of bilateral lacrimal glands: Secondary | ICD-10-CM | POA: Diagnosis not present

## 2018-04-30 DIAGNOSIS — N2589 Other disorders resulting from impaired renal tubular function: Secondary | ICD-10-CM | POA: Diagnosis not present

## 2018-04-30 DIAGNOSIS — N200 Calculus of kidney: Secondary | ICD-10-CM | POA: Diagnosis not present

## 2018-04-30 DIAGNOSIS — Z3490 Encounter for supervision of normal pregnancy, unspecified, unspecified trimester: Secondary | ICD-10-CM | POA: Diagnosis not present

## 2018-05-29 DIAGNOSIS — E538 Deficiency of other specified B group vitamins: Secondary | ICD-10-CM | POA: Diagnosis not present

## 2018-06-11 ENCOUNTER — Ambulatory Visit (INDEPENDENT_AMBULATORY_CARE_PROVIDER_SITE_OTHER): Payer: Self-pay | Admitting: Plastic Surgery

## 2018-06-11 DIAGNOSIS — Z719 Counseling, unspecified: Secondary | ICD-10-CM | POA: Insufficient documentation

## 2018-06-11 NOTE — Progress Notes (Signed)
Botulinum Toxin Procedure Note  Procedure: Cosmetic botulinum toxin  Pre-operative Diagnosis: Dynamic rhytides  Post-operative Diagnosis: Same  Complications:  None  Brief history: The patient desires botulinum toxin injection of her forehead. I discussed with the patient this proposed procedure of botulinum toxin injections, which is customized depending on the particular needs of the patient. It is performed on facial rhytids as a temporary correction. The alternatives were discussed with the patient. The risks were addressed including bleeding, scarring, infection, damage to deeper structures, asymmetry, and chronic pain, which may occur infrequently after a procedure. The individual's choice to undergo a surgical procedure is based on the comparison of risks to potential benefits. Other risks include unsatisfactory results, brow ptosis, eyelid ptosis, allergic reaction, temporary paralysis, which should go away with time, bruising, blurring disturbances and delayed healing. Botulinum toxin injections do not arrest the aging process or produce permanent tightening of the eyelid.  Operative intervention maybe necessary to maintain the results of a blepharoplasty or botulinum toxin. The patient understands and wishes to proceed. An informed consent was signed and informational brochures given to her prior to the procedure.  Procedure: The area was prepped with alcohol and dried with a clean gauze. Using a clean technique, the botulinum toxin was diluted with 1.25 cc of preservative-free normal saline which was slowly injected with an 18 gauge needle in a tuberculin syringes.  A 32 gauge needles were then used to inject the botulinum toxin. This mixture allow for an aliquot of 5 units per 0.1 cc in each injection site.    Subsequently the mixture was injected in the glabellar and forehead area with preservation of the temporal branch to the lateral eyebrow as well as into each lateral canthal area  beginning from the lateral orbital rim medial to the zygomaticus major in 3 separate areas. A total of 30 Units of botulinum toxin was used. The forehead and glabellar area was injected with care to inject intramuscular only while holding pressure on the supratrochlear vessels in each area during each injection on either side of the medial corrugators. The injection proceeded vertically superiorly to the medial 2/3 of the frontalis muscle and superior 2/3 of the lateral frontalis, again with preservation of the frontal branch. No complications were noted. Light pressure was held for 5 minutes. She was instructed explicitly in post-operative care.  Botox LOT:  [id number] EXP:  3/23

## 2018-07-22 IMAGING — MR MR HEAD WO/W CM
11 of 12 series · 40 of 48 positions shown · IV contrast (9ml multihance)
Comparison: Temporal bone CT 05/05/2016

CLINICAL DATA: Right ear conductive hearing loss. Right eye pain
and ptosis. History of cholesteatoma.

EXAM:
MRI HEAD WITHOUT AND WITH CONTRAST
TECHNIQUE: Multiplanar, multiecho pulse sequences of the brain and surrounding
structures were obtained without and with intravenous contrast.
CONTRAST:  9mL MULTIHANCE GADOBENATE DIMEGLUMINE 529 MG/ML IV SOLN

[Series 2: T1 · sagittal · 5.0mm · 0.45mm/px · 3 of 21 slices shown (1 of 3)]
[im 1/21]
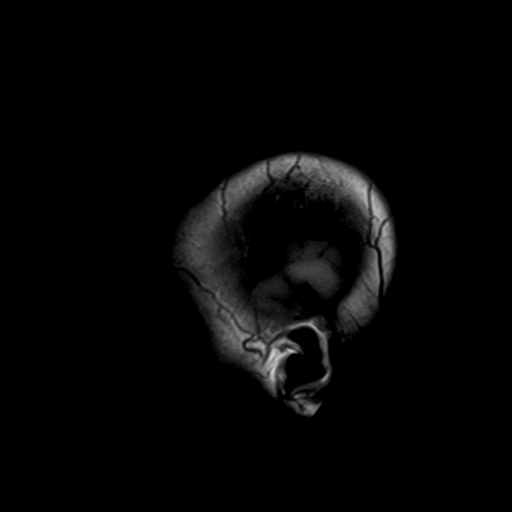
[im 11/21]
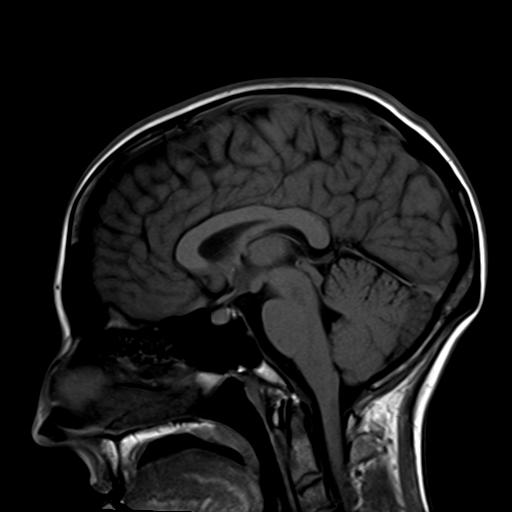
[im 21/21]
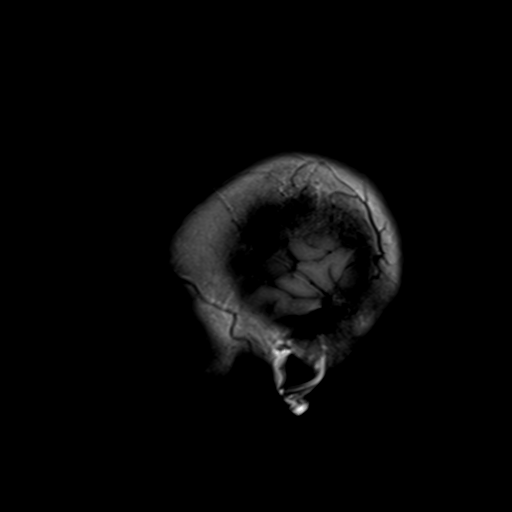

[Series 3: T2 · axial · 5.0mm · 0.51mm/px · z∈[-95,+45]mm · 4 of 24 slices shown]
[im 1/24]
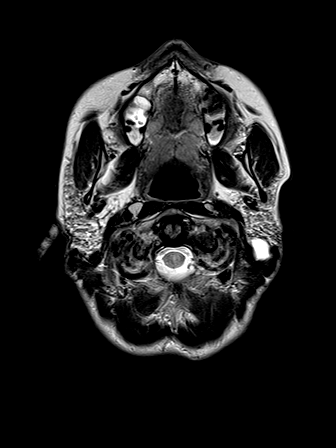
[im 8/24]
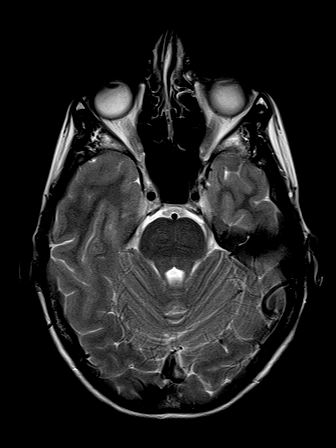
[im 16/24]
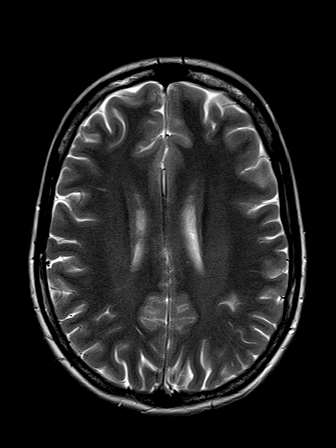
[im 24/24]
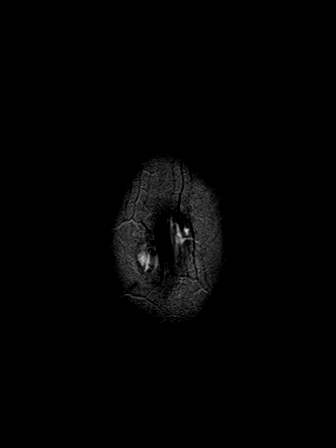

[Series 4: DWI · axial · 3.0mm · 1.80mm/px · z∈[-97,+41]mm · 11 of 96 slices shown (1 of 2)]
[im 1/96]
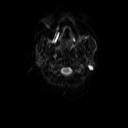
[im 10/96]
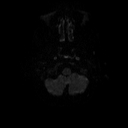
[im 20/96]
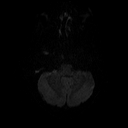
[im 29/96]
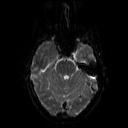
[im 39/96]
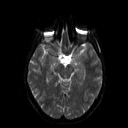
[im 48/96]
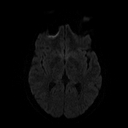
[im 58/96]
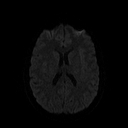
[im 67/96]
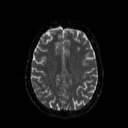
[im 77/96]
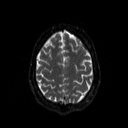
[im 86/96]
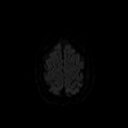
[im 96/96]
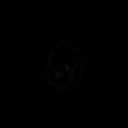

[Series 5: DWI · axial · 3.0mm · 1.80mm/px · z∈[-97,+41]mm · 5 of 47 slices shown (2 of 2)]
[im 1/47]
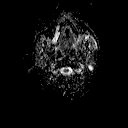
[im 12/47]
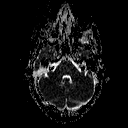
[im 24/47]
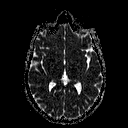
[im 35/47]
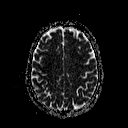
[im 47/47]
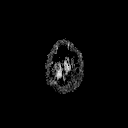

[Series 6: T1 · coronal · 3.0mm · 0.35mm/px · 1 of 11 slices shown (2 of 3)]
[im 1/11]
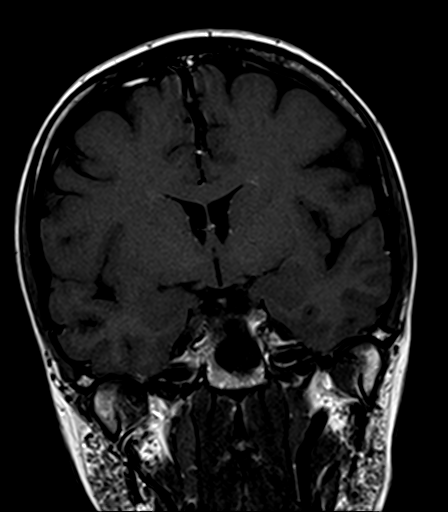

[Series 8: swi_images · axial · 3.0mm · 0.90mm/px · z∈[-94,+43]mm · 6 of 48 slices shown]
[im 1/48]
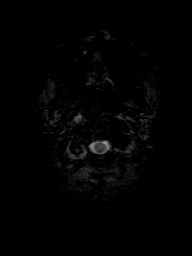
[im 10/48]
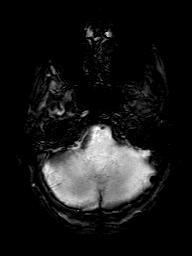
[im 19/48]
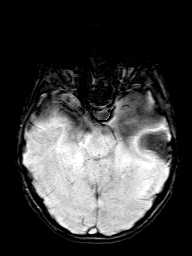
[im 29/48]
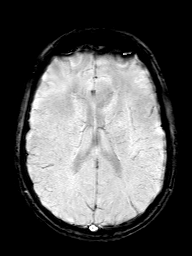
[im 38/48]
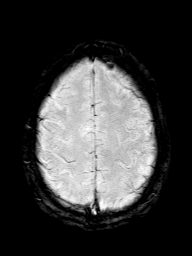
[im 48/48]
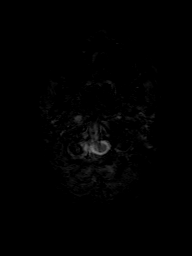

[Series 9: FLAIR · axial · 3.0mm · 0.45mm/px · z∈[-97,+38]mm · 3 of 30 slices shown]
[im 1/30]
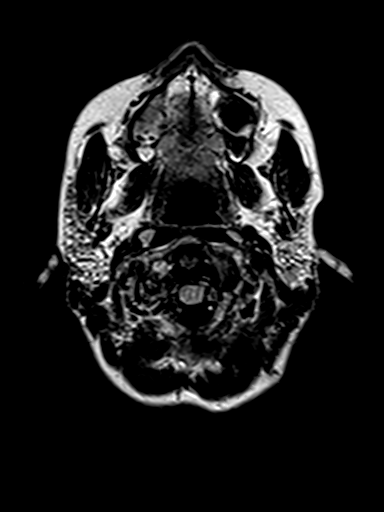
[im 15/30]
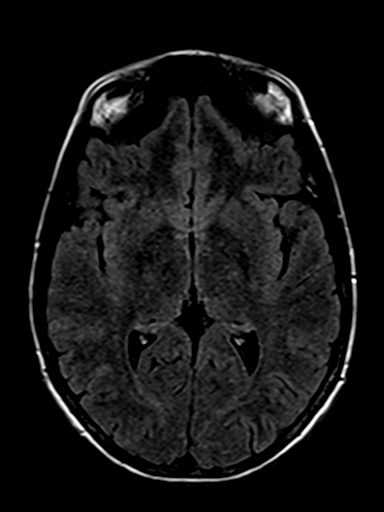
[im 30/30]
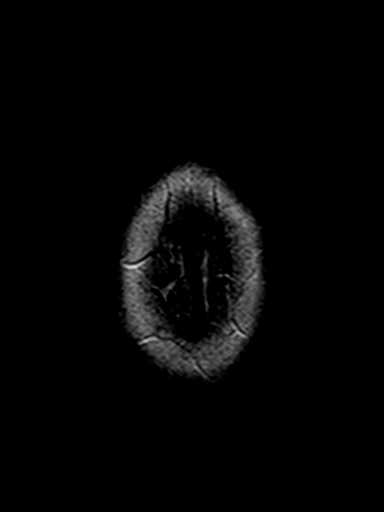

[Series 10: T1 · axial · 3.0mm · 0.35mm/px · 1 of 11 slices shown (3 of 3)]
[im 1/11]
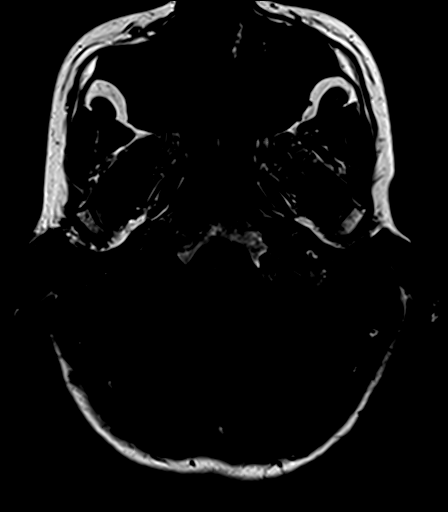

[Series 11: bSSFP · axial · 1.0mm · 0.28mm/px · z∈[-76,-42]mm · 4 of 36 slices shown]
[im 1/36]
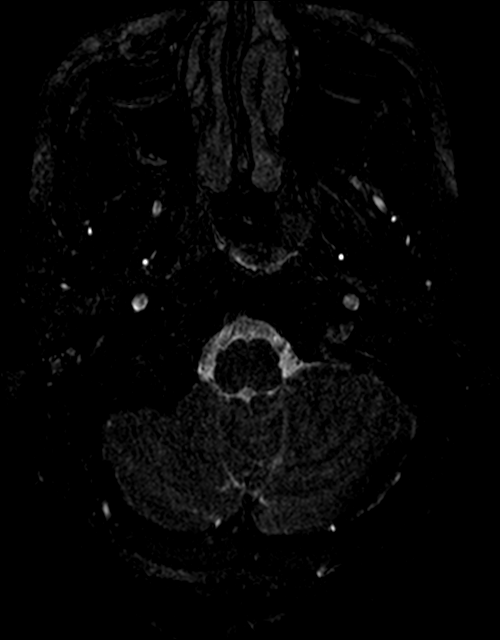
[im 12/36]
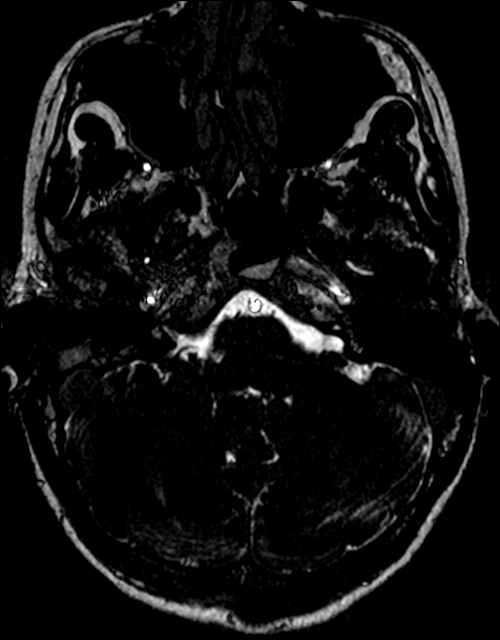
[im 24/36]
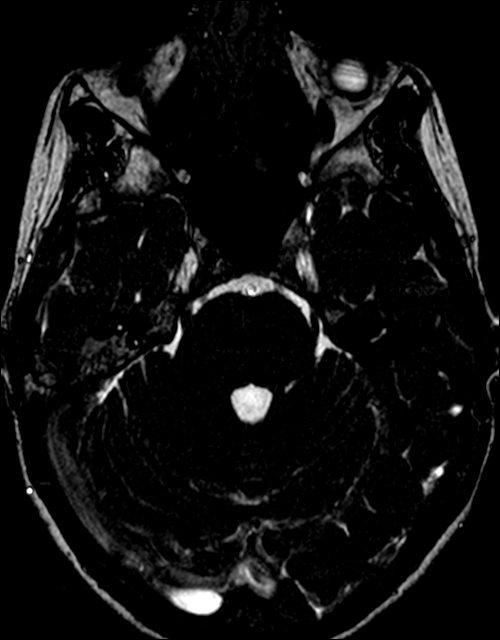
[im 36/36]
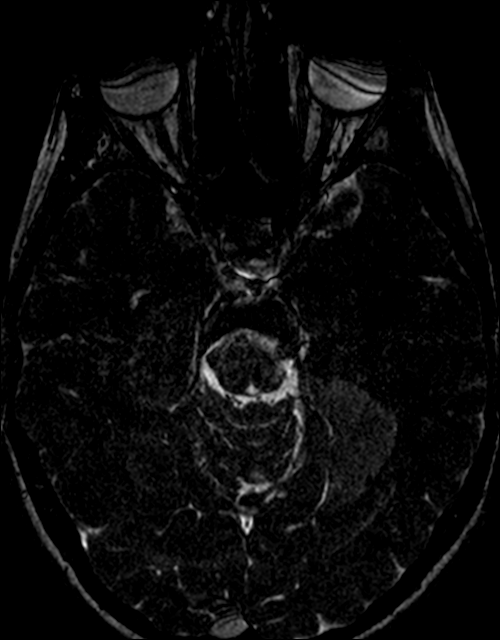

[Series 13: T1 post-contrast · coronal · 3.0mm · 0.35mm/px · 1 of 11 slices shown (1 of 2)]
[im 1/11]
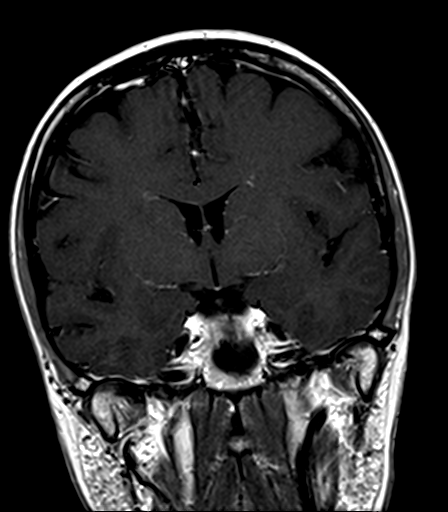

[Series 14: T1 post-contrast · axial · 3.0mm · 0.35mm/px · 1 of 11 slices shown (2 of 2)]
[im 1/11]
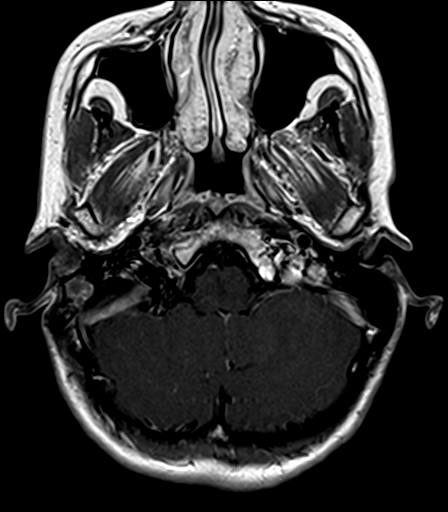

[40 of 48 positions shown; findings below may reference images not displayed]

FINDINGS: Brain: The midline structures are normal. There is no acute infarct
or acute hemorrhage. No mass lesion, hydrocephalus, dural
abnormality or extra-axial collection. The brain parenchymal signal
is normal. No age-advanced or lobar predominant atrophy. No chronic
microhemorrhage or superficial siderosis.

Vascular: Major intracranial arterial and venous sinus flow voids
are preserved.

Skull and upper cervical spine: The visualized skull base,
calvarium, upper cervical spine and extracranial soft tissues are
normal.

Sinuses/Orbits: No fluid levels or advanced mucosal thickening. No
mastoid or middle ear effusion. Normal orbits.

Internal Auditory Canals: Status post right mastoidectomy for
resection of epitympanic lesion. Areas of contrast enhancement at
the mastoidectomy site are likely postoperative. There is no
residual diffusion restriction at the resection site. The cochlea
and semi circular canals are normal. The vestibular aqueducts are
nondilated. There is no cerebellopontine angle mass.
IMPRESSION: 1. Status post right mastoidectomy for resection of epitympanic mass
without evidence of recurrent or residual cholesteatoma..
2. Normal appearance of the brain.

## 2018-07-30 DIAGNOSIS — Z01419 Encounter for gynecological examination (general) (routine) without abnormal findings: Secondary | ICD-10-CM | POA: Diagnosis not present

## 2018-07-30 DIAGNOSIS — Z681 Body mass index (BMI) 19 or less, adult: Secondary | ICD-10-CM | POA: Diagnosis not present

## 2018-07-31 ENCOUNTER — Other Ambulatory Visit: Payer: Self-pay | Admitting: Obstetrics & Gynecology

## 2018-07-31 DIAGNOSIS — N6310 Unspecified lump in the right breast, unspecified quadrant: Secondary | ICD-10-CM

## 2018-07-31 DIAGNOSIS — N6320 Unspecified lump in the left breast, unspecified quadrant: Secondary | ICD-10-CM

## 2018-08-05 ENCOUNTER — Ambulatory Visit
Admission: RE | Admit: 2018-08-05 | Discharge: 2018-08-05 | Disposition: A | Discharge status: Home / Self Care | Payer: 59 | Source: Ambulatory Visit | Attending: Obstetrics & Gynecology | Admitting: Obstetrics & Gynecology

## 2018-08-05 ENCOUNTER — Other Ambulatory Visit: Payer: Self-pay | Admitting: Obstetrics & Gynecology

## 2018-08-05 DIAGNOSIS — N6310 Unspecified lump in the right breast, unspecified quadrant: Secondary | ICD-10-CM

## 2018-08-05 DIAGNOSIS — N6320 Unspecified lump in the left breast, unspecified quadrant: Secondary | ICD-10-CM

## 2018-08-05 DIAGNOSIS — R928 Other abnormal and inconclusive findings on diagnostic imaging of breast: Secondary | ICD-10-CM | POA: Diagnosis not present

## 2018-08-05 DIAGNOSIS — N6489 Other specified disorders of breast: Secondary | ICD-10-CM | POA: Diagnosis not present

## 2018-08-08 DIAGNOSIS — I7300 Raynaud's syndrome without gangrene: Secondary | ICD-10-CM | POA: Diagnosis not present

## 2018-08-08 DIAGNOSIS — R109 Unspecified abdominal pain: Secondary | ICD-10-CM | POA: Diagnosis not present

## 2018-08-08 DIAGNOSIS — Z79899 Other long term (current) drug therapy: Secondary | ICD-10-CM | POA: Diagnosis not present

## 2018-08-08 DIAGNOSIS — M3509 Sicca syndrome with other organ involvement: Secondary | ICD-10-CM | POA: Diagnosis not present

## 2018-08-26 DIAGNOSIS — H04123 Dry eye syndrome of bilateral lacrimal glands: Secondary | ICD-10-CM | POA: Diagnosis not present

## 2018-09-05 ENCOUNTER — Other Ambulatory Visit: Payer: Self-pay

## 2018-09-19 DIAGNOSIS — H9011 Conductive hearing loss, unilateral, right ear, with unrestricted hearing on the contralateral side: Secondary | ICD-10-CM | POA: Diagnosis not present

## 2018-09-19 DIAGNOSIS — J342 Deviated nasal septum: Secondary | ICD-10-CM | POA: Diagnosis not present

## 2018-09-19 DIAGNOSIS — H73891 Other specified disorders of tympanic membrane, right ear: Secondary | ICD-10-CM | POA: Diagnosis not present

## 2019-03-03 ENCOUNTER — Other Ambulatory Visit: Payer: Self-pay

## 2019-03-03 ENCOUNTER — Ambulatory Visit: Payer: Self-pay | Admitting: Adult Health

## 2019-03-03 DIAGNOSIS — Z411 Encounter for cosmetic surgery: Secondary | ICD-10-CM

## 2019-03-03 NOTE — Progress Notes (Signed)
Patient treated with Botox Cosmetic/ OnabotulinumtoxinA  Risks and Benefits explained to patient, Written consent obtained and signed.   Lot#: [id number] Exp: 08/2021  Glabellar: 7 Units Frontalis: 18 units Crows Feet: 0 Units Other: 0 Units  Total: 25 Units  Aftercare instructions given to patient.  Patient denied any questions.  Will follow up in 14 days for results review.

## 2019-03-13 ENCOUNTER — Other Ambulatory Visit: Payer: Self-pay

## 2019-03-13 ENCOUNTER — Ambulatory Visit: Payer: 59 | Admitting: Adult Health

## 2019-04-04 ENCOUNTER — Ambulatory Visit: Payer: 59 | Admitting: Adult Health

## 2019-04-09 ENCOUNTER — Ambulatory Visit: Payer: Self-pay | Admitting: Adult Health

## 2019-04-09 ENCOUNTER — Other Ambulatory Visit: Payer: Self-pay

## 2019-04-09 DIAGNOSIS — Z411 Encounter for cosmetic surgery: Secondary | ICD-10-CM

## 2019-04-09 NOTE — Progress Notes (Signed)
Patient treated with Botox Cosmetic/ OnabotulinumtoxinA  Risks and Benefits explained to patient, Written consent obtained and signed.   Lot#: [id number] Exp: 08/2021  Glabellar: 0Units Frontalis: 15 units Crows Feet: 0 Units Other: 0 Units  Total: 15 Units  Aftercare instructions given to patient.  Patient denied any questions.  Will follow up in 14 days for results review.

## 2019-04-13 ENCOUNTER — Encounter (HOSPITAL_COMMUNITY): Payer: Self-pay

## 2019-04-13 ENCOUNTER — Emergency Department (HOSPITAL_COMMUNITY): Payer: 59

## 2019-04-13 ENCOUNTER — Emergency Department (HOSPITAL_COMMUNITY)
Admission: EM | Admit: 2019-04-13 | Discharge: 2019-04-13 | Disposition: A | Discharge status: Home / Self Care | Payer: 59 | Attending: Emergency Medicine | Admitting: Emergency Medicine

## 2019-04-13 DIAGNOSIS — N201 Calculus of ureter: Secondary | ICD-10-CM

## 2019-04-13 DIAGNOSIS — Z87891 Personal history of nicotine dependence: Secondary | ICD-10-CM | POA: Insufficient documentation

## 2019-04-13 DIAGNOSIS — N2589 Other disorders resulting from impaired renal tubular function: Secondary | ICD-10-CM | POA: Insufficient documentation

## 2019-04-13 DIAGNOSIS — N202 Calculus of kidney with calculus of ureter: Secondary | ICD-10-CM | POA: Insufficient documentation

## 2019-04-13 DIAGNOSIS — R1032 Left lower quadrant pain: Secondary | ICD-10-CM | POA: Diagnosis present

## 2019-04-13 DIAGNOSIS — Z79899 Other long term (current) drug therapy: Secondary | ICD-10-CM | POA: Insufficient documentation

## 2019-04-13 DIAGNOSIS — J45909 Unspecified asthma, uncomplicated: Secondary | ICD-10-CM | POA: Insufficient documentation

## 2019-04-13 DIAGNOSIS — N200 Calculus of kidney: Secondary | ICD-10-CM

## 2019-04-13 LAB — URINALYSIS, ROUTINE W REFLEX MICROSCOPIC
Bacteria, UA: NONE SEEN
Bilirubin Urine: NEGATIVE
Glucose, UA: NEGATIVE mg/dL
Ketones, ur: NEGATIVE mg/dL
Nitrite: NEGATIVE
Protein, ur: NEGATIVE mg/dL
Specific Gravity, Urine: 1.004 — ABNORMAL LOW (ref 1.005–1.030)
pH: 7 (ref 5.0–8.0)

## 2019-04-13 LAB — CBC
HCT: 41 % (ref 36.0–46.0)
Hemoglobin: 13 g/dL (ref 12.0–15.0)
MCH: 27.5 pg (ref 26.0–34.0)
MCHC: 31.7 g/dL (ref 30.0–36.0)
MCV: 86.7 fL (ref 80.0–100.0)
Platelets: 205 10*3/uL (ref 150–400)
RBC: 4.73 MIL/uL (ref 3.87–5.11)
RDW: 12.8 % (ref 11.5–15.5)
WBC: 5.9 10*3/uL (ref 4.0–10.5)
nRBC: 0 % (ref 0.0–0.2)

## 2019-04-13 LAB — COMPREHENSIVE METABOLIC PANEL
ALT: 16 U/L (ref 0–44)
AST: 22 U/L (ref 15–41)
Albumin: 3.8 g/dL (ref 3.5–5.0)
Alkaline Phosphatase: 76 U/L (ref 38–126)
Anion gap: 10 (ref 5–15)
BUN: 8 mg/dL (ref 6–20)
CO2: 23 mmol/L (ref 22–32)
Calcium: 9 mg/dL (ref 8.9–10.3)
Chloride: 103 mmol/L (ref 98–111)
Creatinine, Ser: 0.85 mg/dL (ref 0.44–1.00)
GFR calc Af Amer: >60 mL/min (ref >60)
GFR calc non Af Amer: >60 mL/min (ref >60)
Glucose, Bld: 96 mg/dL (ref 70–99)
Potassium: 3.8 mmol/L (ref 3.5–5.1)
Sodium: 136 mmol/L (ref 135–145)
Total Bilirubin: 0.3 mg/dL (ref 0.3–1.2)
Total Protein: 7.4 g/dL (ref 6.5–8.1)

## 2019-04-13 LAB — I-STAT BETA HCG BLOOD, ED (MC, WL, AP ONLY): I-stat hCG, quantitative: <5 m[IU]/mL (ref <5)

## 2019-04-13 LAB — LIPASE, BLOOD: Lipase: 33 U/L (ref 11–51)

## 2019-04-13 MED ORDER — OXYCODONE-ACETAMINOPHEN 5-325 MG PO TABS
1.0000 | ORAL_TABLET | Freq: Once | ORAL | Status: AC
  Administered 2019-04-13: 1 via ORAL
  Filled 2019-04-13: qty 1

## 2019-04-13 MED ORDER — HYDROCODONE-ACETAMINOPHEN 5-325 MG PO TABS: 1.0000 | ORAL_TABLET | Freq: Four times a day (QID) | ORAL | 0 refills | Status: DC | PRN

## 2019-04-13 MED ORDER — MORPHINE SULFATE (PF) 4 MG/ML IV SOLN: 4.0000 mg | Freq: Once | INTRAVENOUS | Status: DC

## 2019-04-13 MED ORDER — LIDOCAINE HCL (CARDIAC) PF 100 MG/5ML IV SOSY
1.5000 mg/kg | PREFILLED_SYRINGE | Freq: Once | INTRAVENOUS | Status: DC
  Filled 2019-04-13: qty 5

## 2019-04-13 MED ORDER — KETOROLAC TROMETHAMINE 15 MG/ML IJ SOLN
30.0000 mg | Freq: Once | INTRAMUSCULAR | Status: AC
  Administered 2019-04-13: 30 mg via INTRAMUSCULAR
  Filled 2019-04-13: qty 2

## 2019-04-13 MED ORDER — ONDANSETRON 4 MG PO TBDP
4.0000 mg | ORAL_TABLET | Freq: Once | ORAL | Status: AC
  Administered 2019-04-13: 4 mg via ORAL
  Filled 2019-04-13: qty 1

## 2019-04-13 MED ORDER — KETOROLAC TROMETHAMINE 15 MG/ML IJ SOLN
15.0000 mg | Freq: Once | INTRAMUSCULAR | Status: DC
  Filled 2019-04-13: qty 1

## 2019-04-13 MED ORDER — HYDROCODONE-ACETAMINOPHEN 5-325 MG PO TABS: 1.0000 | ORAL_TABLET | Freq: Four times a day (QID) | ORAL | 0 refills | Status: AC | PRN

## 2019-04-13 MED ORDER — SODIUM CHLORIDE 0.9% FLUSH: 3.0000 mL | Freq: Once | INTRAVENOUS | Status: DC

## 2019-04-13 MED ORDER — ONDANSETRON 4 MG PO TBDP: 4.0000 mg | ORAL_TABLET | Freq: Three times a day (TID) | ORAL | 0 refills | Status: AC | PRN

## 2019-04-13 NOTE — ED Triage Notes (Signed)
Pt states that since 2am she has been having L sided flank pain with lower abd pain, hx of kidney stones, this is the worst, vomited several times

## 2019-04-13 NOTE — ED Notes (Signed)
Patient transported to CT 

## 2019-04-13 NOTE — ED Provider Notes (Signed)
[facility] EMERGENCY DEPARTMENT Provider Note   CSN: [id number] Arrival date & time: 04/13/19  2023    History   Chief Complaint Chief Complaint  Patient presents with   Abdominal Pain    HPI [name] Davis is a 38 y.o. female.     HPI  Patient presents for evaluation of abdominal pain.  She states that started around 3:00 this morning.  Has been on and off again on the left side.  Reports long history of frequent kidney stones but that this feels much worse.  Has taken ibuprofen and Flomax without relief.  Chills but no fever.  Still urinating.  No dysuria.  Past Medical History:  Diagnosis Date   Asthma    GERD (gastroesophageal reflux disease)    History of shingles    HSV-1 (herpes simplex virus 1) infection    denies any vaginal lesiona   Hx of varicella    IBS (irritable bowel syndrome)    Lupus (HCC)    Osteopenia    Sjoegren syndrome    Vaginal Pap smear, abnormal     Patient Active Problem List   Diagnosis Date Noted   Encounter for counseling 06/11/2018   IUGR (intrauterine growth restriction) 01/06/2015   Indication for care in labor or delivery 01/05/2015   SGA (small for gestational age) 01/04/2015   Peroneal tendinitis of right lower extremity 11/09/2014   Systemic lupus erythematosus affecting pregnancy in first trimester ([place]) 07/14/2014   Renal disease during pregnancy in first trimester 07/14/2014   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 07/14/2014   [redacted] weeks gestation of pregnancy 07/14/2014   Sjogren's syndrome ([place]) 05/13/2013   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 Authorizing Provider  docusate sodium (COLACE) 100 MG capsule Take 100 mg by mouth 2 (two) times daily as needed for mild constipation.    [provider]  HYDROcodone-acetaminophen (NORCO) 5-325 MG tablet Take 1-2 tablets by mouth every 6 (six) hours as needed for severe pain. 04/13/19   Elnora Morrison, MD  ibuprofen (ADVIL,MOTRIN) 600 MG tablet Take 1 tablet (600 mg total) by mouth every 8 (eight) hours as needed for moderate pain. 02/10/15   Sherwood Gambler, MD  lisdexamfetamine (VYVANSE) 50 MG capsule Take 50 mg by mouth daily.    [provider]  ondansetron (ZOFRAN-ODT) 4 MG disintegrating tablet Take 1 tablet (4 mg total) by mouth every 8 (eight) hours as needed for nausea or vomiting. 04/13/19   Tillie Fantasia, MD  oxyCODONE-acetaminophen (PERCOCET/ROXICET) 5-325 MG per tablet Take 1 tablet by mouth every 4 (four) hours as needed (for pain scale 4-7). 01/08/15   Juanda Chance, NP  potassium chloride SA (K-DUR,KLOR-CON) 20 MEQ tablet Take 1 tablet (20 mEq total) by mouth daily. 02/10/15   Sherwood Gambler, MD  Prenatal Vit-Fe Fumarate-FA (PRENATAL MULTIVITAMIN) TABS Take 1 tablet by mouth daily at 12 noon.    [provider]  tamsulosin (FLOMAX) 0.4 MG CAPS capsule Take 1 capsule (0.4 mg total) by mouth daily. 02/10/15   Sherwood Gambler, MD    Family History Family History  Problem Relation Age of Onset   Cancer Maternal Grandfather        multiple myeloma   Thyroid disease Mother    Thyroid disease Sister    Heart attack Maternal Grandmother    Diabetes Maternal Grandmother    Thyroid disease Paternal Grandmother     Social History Social History   Tobacco Use   Smoking status: Former Smoker    Types: Cigarettes  Substance Use Topics   Alcohol use:  Yes    Comment: occassional   Drug use: No     Allergies   Patient has no known allergies.   Review of Systems Review of Systems  Constitutional: Negative for chills and fever.  HENT: Negative for ear pain and sore throat.   Eyes: Negative for pain and visual disturbance.  Respiratory: Negative for cough and shortness of breath.   Cardiovascular: Negative for chest pain and palpitations.  Gastrointestinal: Positive for abdominal pain. Negative for vomiting.  Genitourinary: Positive for flank pain. Negative for dysuria and hematuria.  Musculoskeletal: Negative for arthralgias and back pain.  Skin: Negative for color change and rash.  Neurological: Negative for seizures and syncope.  All other systems reviewed and are negative.    Physical Exam Updated Vital Signs BP 119/86    Pulse 70    Temp 98.4 F (36.9 C) (Oral)    Resp 16    SpO2 100%   Physical Exam Vitals signs and nursing note reviewed.  Constitutional:      Appearance: She is well-developed.     Comments: Appears uncomfortable nontoxic  HENT:     Head: Normocephalic and atraumatic.  Eyes:     Conjunctiva/sclera: Conjunctivae normal.  Neck:     Musculoskeletal: Neck supple.  Cardiovascular:     Rate and Rhythm: Normal rate and regular rhythm.     Heart sounds: No murmur.  Pulmonary:     Effort: Pulmonary effort is normal. No respiratory distress.     Breath sounds: Normal breath sounds.  Abdominal:     Palpations: Abdomen is soft.     Tenderness: There is abdominal tenderness.     Comments: Tenderness in left upper quadrant and left CVA  Skin:    General: Skin is warm and dry.  Neurological:     Mental Status: She is alert and oriented to person, place, and time.      ED Treatments / Results  Labs (all labs ordered are listed, but only abnormal results are displayed) Labs Reviewed  URINALYSIS, ROUTINE W REFLEX MICROSCOPIC - Abnormal; Notable for the following components:      Result Value    Color, Urine STRAW (*)    Specific Gravity, Urine 1.004 (*)    Hgb urine dipstick MODERATE (*)    Leukocytes,Ua SMALL (*)    All other components within normal limits  LIPASE, BLOOD  COMPREHENSIVE METABOLIC PANEL  CBC  I-STAT BETA HCG BLOOD, ED ([facility], [facility], [facility] ONLY)    EKG None  Radiology Ct Renal Stone Study  Result Date: 04/13/2019 CLINICAL DATA:  Flank pain with  recurrent stone disease suspected. EXAM: CT ABDOMEN AND PELVIS WITHOUT CONTRAST TECHNIQUE: Multidetector CT imaging of the abdomen and pelvis was performed following the standard protocol without IV contrast. COMPARISON:  None. FINDINGS: Lower chest: There is a 6 mm pulmonary nodule at the left lung base (axial series 4, image 7).The heart size is normal. Hepatobiliary: The liver is normal. Normal gallbladder.There is no biliary ductal dilation. Pancreas: Normal contours without ductal dilatation. No peripancreatic fluid collection. Spleen: No splenic laceration or hematoma. Adrenals/Urinary Tract: --Adrenal glands: No adrenal hemorrhage. --Right kidney/ureter: Again noted are calcifications the medullary region of the right kidney. There is no right-sided hydronephrosis. --Left kidney/ureter: Extensive calcifications are noted in the medullary portion of the left kidney. There is mild left-sided hydronephrosis the secondary to a 3 mm stone in the proximal left ureter. There is dilatation of the ureter beyond this stone with a probable 5 mm stone in the distal left ureter nearly at the left UVJ. --Urinary bladder: Unremarkable. Stomach/Bowel: --Stomach/Duodenum: No hiatal hernia or other gastric abnormality. Normal duodenal course and caliber. --Small bowel: No dilatation or inflammation. --Colon: There is a moderate amount of stool in the colon. --Appendix: Normal. Vascular/Lymphatic: Normal course and caliber of the major abdominal vessels. --No retroperitoneal lymphadenopathy. --No mesenteric lymphadenopathy. --No pelvic or inguinal  lymphadenopathy. Reproductive: There is an IUD in place. Other: No ascites or free air. The abdominal wall is normal. Musculoskeletal. No acute displaced fractures. IMPRESSION: 1. Mild left-sided hydroureteronephrosis that appears to be secondary to both a proximal 3 mm ureteral stone as well as a 5 mm stone in the distal left ureter. 2. Extensive calcifications bilaterally consistent with nephrocalcinosis as seen on prior renal ultrasound. 3. There is a 6 mm pulmonary nodule in the left lower lobe. Non-contrast chest CT at 6-12 months is recommended. If the nodule is stable at time of repeat CT, then future CT at 18-24 months (from today's scan) is considered optional for low-risk patients, but is recommended for high-risk patients. This recommendation follows the consensus statement: Guidelines for Management of Incidental Pulmonary Nodules Detected on CT Images: From the Fleischner Society 2017; Radiology 2017; 284:228-243. Electronically Signed   By: Constance Holster M.D.   On: 04/13/2019 22:14    Procedures Procedures (including critical care time)  Medications Ordered in ED Medications  sodium chloride flush (NS) 0.9 % injection 3 mL (has no administration in time range)  ketorolac (TORADOL) 15 MG/ML injection 30 mg (has no administration in time range)  oxyCODONE-acetaminophen (PERCOCET/ROXICET) 5-325 MG per tablet 1 tablet (1 tablet Oral Given 04/13/19 1937)  ondansetron (ZOFRAN-ODT) disintegrating tablet 4 mg (4 mg Oral Given 04/13/19 1937)     Initial Impression / Assessment and Plan / ED Course  I have reviewed the triage vital signs and the nursing notes.  Pertinent labs & imaging results that were available during my care of the patient were reviewed by me and considered in my medical decision making (see chart for details).        Ms. Hirsch is a 38 year old female with complex medical history.  Notably she has known Sjogren's, SLE, renal tubular acidosis, and recurrent  nephrolithiasis.  History and exam consistent with stones.  No significant Electra abnormality, AKI, leukocytosis, or anemia.  She is not pregnant.  UA is negative for UTI.  Positive for hematuria.  Given that she has a long history of kidney stones and states this felt dramatically different, proceeded with CT scan.  This showed left renal pelvis and left ureter  3 and 5 mm stones with mild hydronephrosis.  She has good chance of passing given the relatively small size.  Focus on symptomatic treatment.  Reviewed this plan with her and she was in agreement.  Gave Toradol intramuscular injection as adjunct for Percocet p.o.  Discharged with instructions to take Tylenol and ibuprofen regularly.  If necessary, can take Norco.  Very short term prescription given.  She will follow-up with her urologist.  Faythe Ghee for discharge.  She vocalized understanding and agree with plan, had no other questions or concerns, and was discharged in good condition.   Final Clinical Impressions(s) / ED Diagnoses   Final diagnoses:  Renal tubular acidosis  Recurrent nephrolithiasis  Ureterolithiasis    ED Discharge Orders         Ordered    HYDROcodone-acetaminophen (NORCO) 5-325 MG tablet  Every 6 hours PRN,   Status:  Discontinued     04/13/19 2320    HYDROcodone-acetaminophen (NORCO) 5-325 MG tablet  Every 6 hours PRN     04/13/19 2321    ondansetron (ZOFRAN-ODT) 4 MG disintegrating tablet  Every 8 hours PRN     04/13/19 2324           Tillie Fantasia, MD 04/13/19 2329    Elnora Morrison, MD 04/13/19 2336

## 2019-04-13 NOTE — ED Notes (Signed)
Patient verbalizes understanding of discharge instructions. Opportunity for questioning and answers were provided. Armband removed by staff, pt discharged from ED.  

## 2019-04-14 NOTE — ED Provider Notes (Signed)
Upon reviewing the patient's chart I realized that she had not been informed of the incidental finding of her left lower lobe pulmonary nodule.  I called the patient at 4 PM this afternoon and verified her identity.  Inquired as to her symptoms today.  She is feeling much better.  Has passed some stones.  Has some nagging pain but overall is improved.  I informed her that she has a 6 mm pulmonary nodule noted on CT scan of the left lower lobe.  I reassured that these incidental findings are likely cause for alarm but that she should be and reassessed per radiology's recommendations in 6 to 12 months with repeat imaging.  Encouraged her to follow-up with her PCP for this.  She vocalized understanding and agreement and had no other questions or concerns.   Tillie Fantasia, MD 04/14/19 1704    Elnora Morrison, MD 04/15/19 [phone]

## 2019-08-30 IMAGING — CT CT RENAL STONE PROTOCOL
2 of 4 series · 15 of 46 positions shown, 17 images · non-contrast
Comparison: None.

CLINICAL DATA: Flank pain with recurrent stone disease suspected.

EXAM:
CT ABDOMEN AND PELVIS WITHOUT CONTRAST
TECHNIQUE: Multidetector CT imaging of the abdomen and pelvis was performed
following the standard protocol without IV contrast.

[Series 3: stone study 5.0 i30f 2 · axial · 0.72mm/px · z∈[+678,+1028]mm · 12 of 84 slices shown, 14 images]
[im 7/84  soft-tissue]
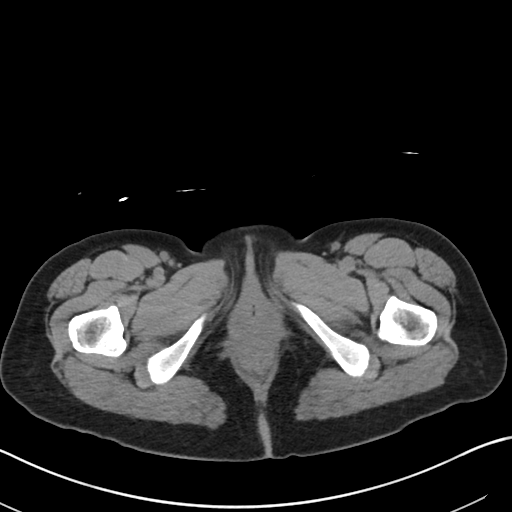
[im 7/84  bone]
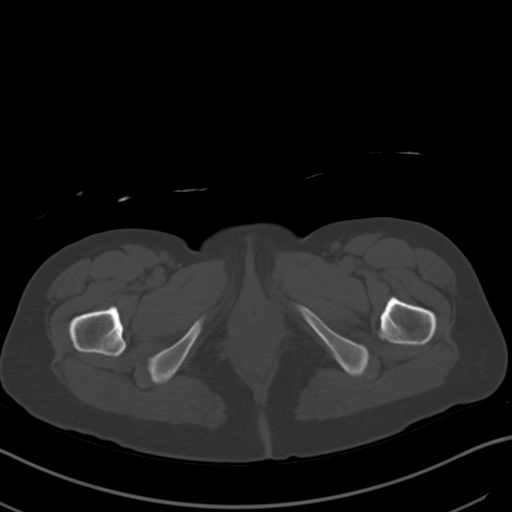
[im 13/84  soft-tissue]
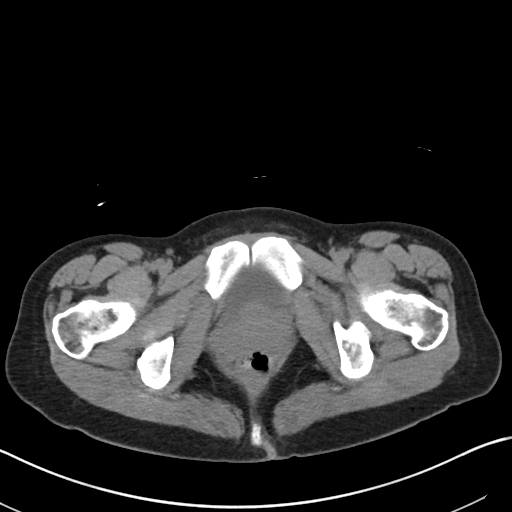
[im 20/84  soft-tissue]
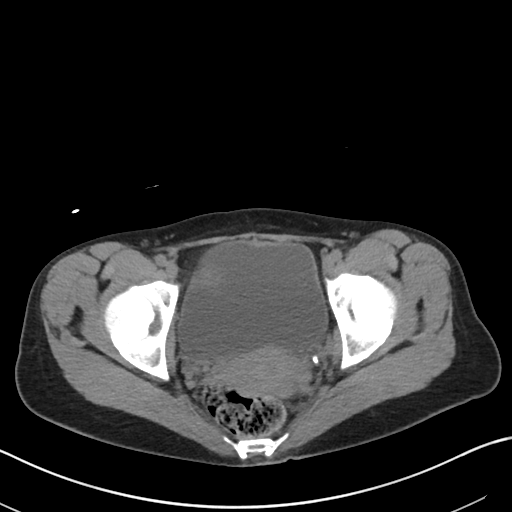
[im 26/84  soft-tissue]
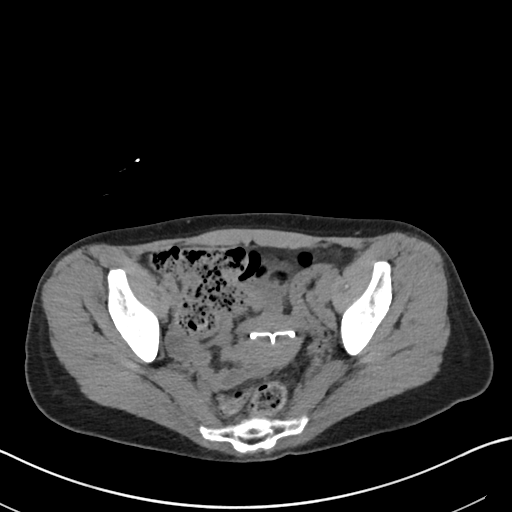
[im 32/84  soft-tissue]
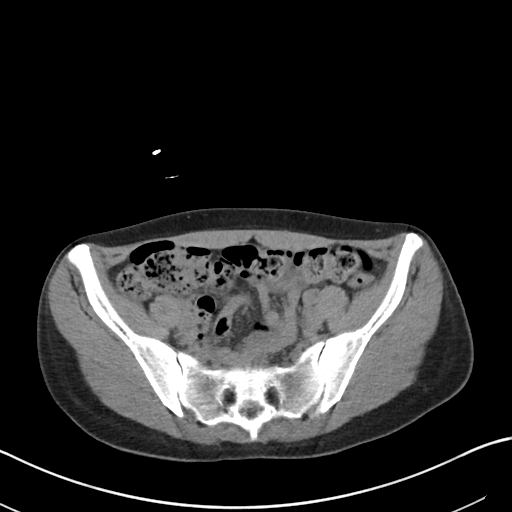
[im 39/84  soft-tissue]
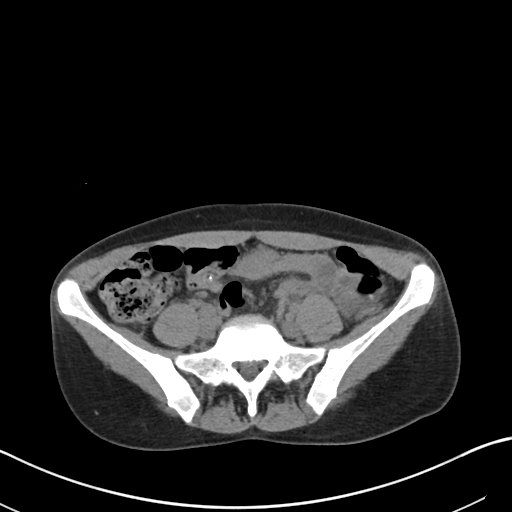
[im 45/84  soft-tissue]
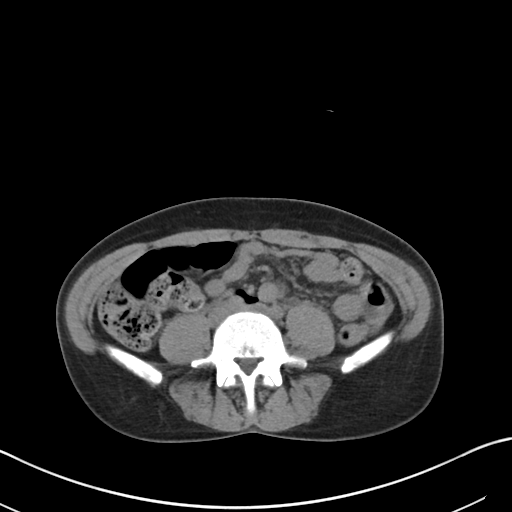
[im 52/84  soft-tissue]
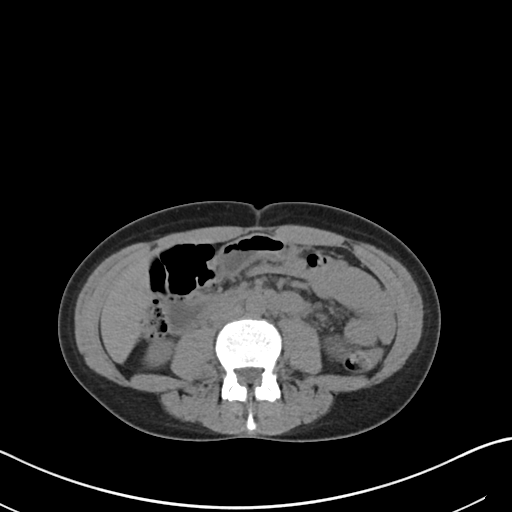
[im 58/84  soft-tissue]
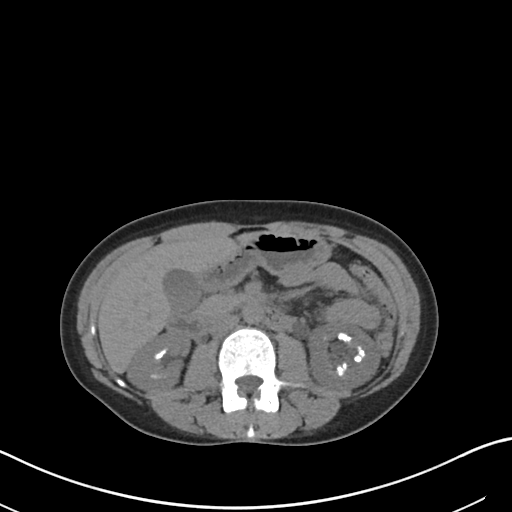
[im 58/84  bone]
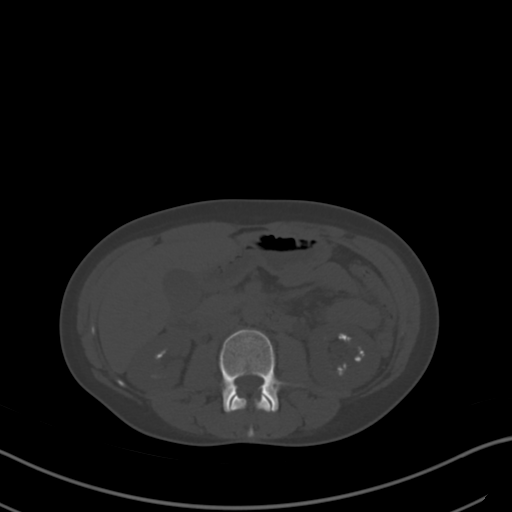
[im 64/84  soft-tissue]
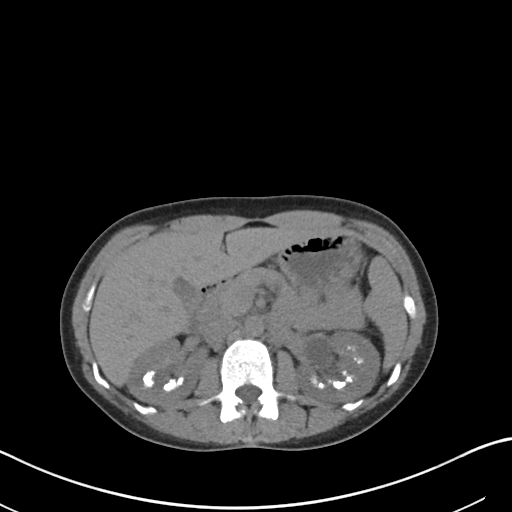
[im 71/84  soft-tissue]
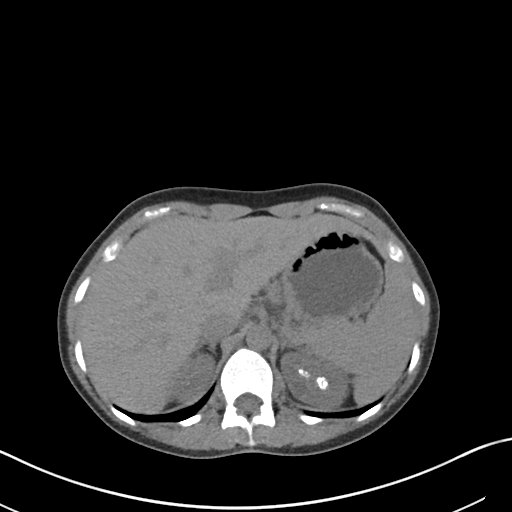
[im 77/84  soft-tissue]
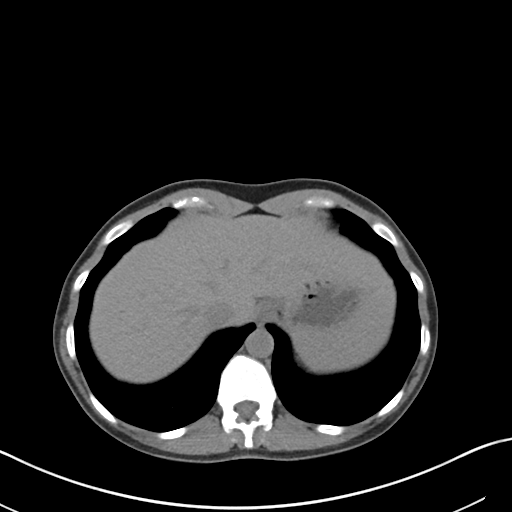

[Series 6: coronal soft tissue · coronal · 0.61mm/px · 3 of 65 slices shown]
[im 22/65  soft-tissue]
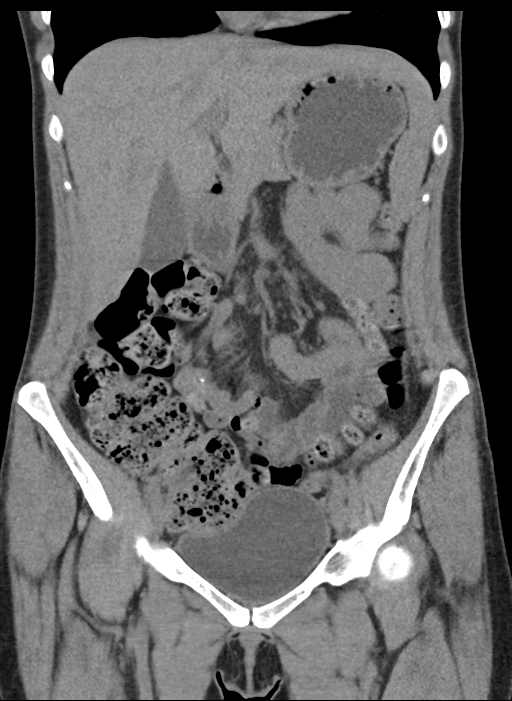
[im 29/65  soft-tissue]
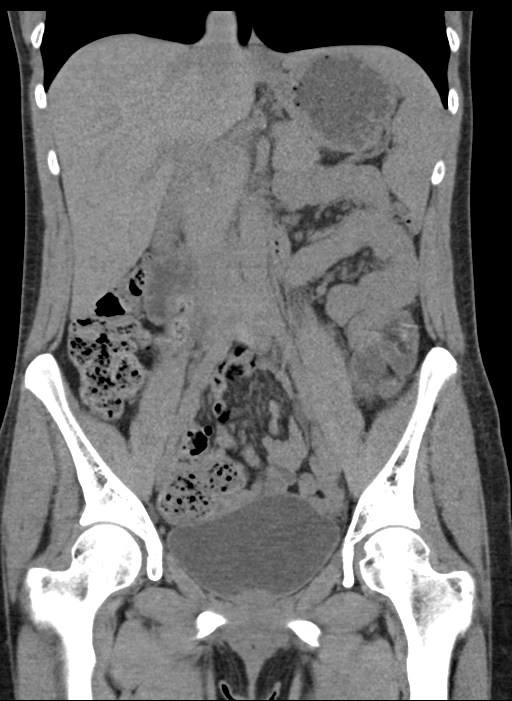
[im 36/65  soft-tissue]
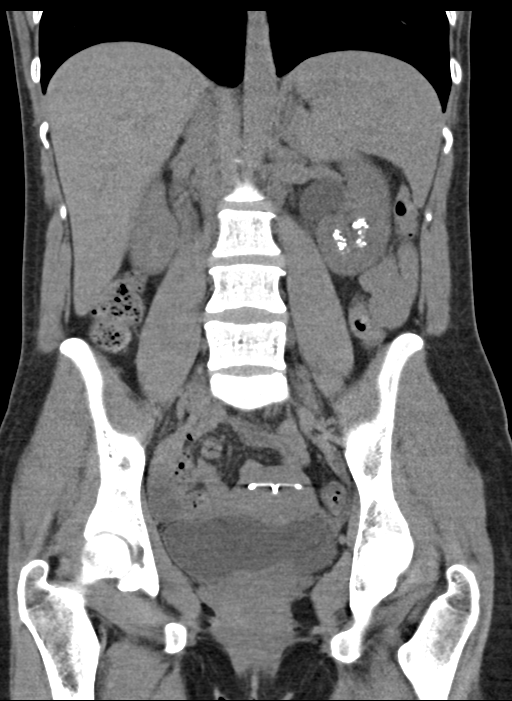

[15 of 46 positions shown; findings below may reference images not displayed]

FINDINGS: Lower chest: There is a 6 mm pulmonary nodule at the left lung base
(axial series 4, image 7).The heart size is normal.

Hepatobiliary: The liver is normal. Normal gallbladder.There is no
biliary ductal dilation.

Pancreas: Normal contours without ductal dilatation. No
peripancreatic fluid collection.

Spleen: No splenic laceration or hematoma.

Adrenals/Urinary Tract:

--Adrenal glands: No adrenal hemorrhage.

--Right kidney/ureter: Again noted are calcifications the medullary
region of the right kidney. There is no right-sided hydronephrosis.

--Left kidney/ureter: Extensive calcifications are noted in the
medullary portion of the left kidney. There is mild left-sided
hydronephrosis the secondary to a 3 mm stone in the proximal left
ureter. There is dilatation of the ureter beyond this stone with a
probable 5 mm stone in the distal left ureter nearly at the left
UVJ.

--Urinary bladder: Unremarkable.

Stomach/Bowel:

--Stomach/Duodenum: No hiatal hernia or other gastric abnormality.
Normal duodenal course and caliber.

--Small bowel: No dilatation or inflammation.

--Colon: There is a moderate amount of stool in the colon.

--Appendix: Normal.

Vascular/Lymphatic: Normal course and caliber of the major abdominal
vessels.

--No retroperitoneal lymphadenopathy.

--No mesenteric lymphadenopathy.

--No pelvic or inguinal lymphadenopathy.

Reproductive: There is an IUD in place.

Other: No ascites or free air. The abdominal wall is normal.

Musculoskeletal. No acute displaced fractures.
IMPRESSION: 1. Mild left-sided hydroureteronephrosis that appears to be
secondary to both a proximal 3 mm ureteral stone as well as a 5 mm
stone in the distal left ureter.
2. Extensive calcifications bilaterally consistent with
nephrocalcinosis as seen on prior renal ultrasound.
3. There is a 6 mm pulmonary nodule in the left lower lobe.
Non-contrast chest CT at 6-12 months is recommended. If the nodule
is stable at time of repeat CT, then future CT at 18-24 months (from
today's scan) is considered optional for low-risk patients, but is
recommended for high-risk patients. This recommendation follows the
consensus statement: Guidelines for Management of Incidental
Pulmonary Nodules Detected on CT Images: From the [HOSPITAL]

## 2019-10-15 ENCOUNTER — Other Ambulatory Visit: Payer: Self-pay | Admitting: Family Medicine

## 2019-10-15 DIAGNOSIS — R911 Solitary pulmonary nodule: Secondary | ICD-10-CM

## 2019-10-21 ENCOUNTER — Ambulatory Visit
Admission: RE | Admit: 2019-10-21 | Discharge: 2019-10-21 | Disposition: A | Discharge status: Home / Self Care | Payer: 59 | Source: Ambulatory Visit | Attending: Family Medicine | Admitting: Family Medicine

## 2019-10-21 DIAGNOSIS — R911 Solitary pulmonary nodule: Secondary | ICD-10-CM

## 2020-04-26 ENCOUNTER — Telehealth: Payer: Self-pay | Admitting: Unknown Physician Specialty

## 2020-04-26 NOTE — Telephone Encounter (Signed)
Called to Discuss with patient about Covid symptoms and the use of the monoclonal antibody infusion for those with mild to moderate Covid symptoms and at a high risk of hospitalization.     Pt is qualified for this infusion due to co-morbid conditions and/or a member of an at-risk group.     Patient Active Problem List   Diagnosis Date Noted  . Encounter for counseling 06/11/2018  . IUGR (intrauterine growth restriction) 01/06/2015  . Indication for care in labor or delivery 01/05/2015  . SGA (small for gestational age) 01/04/2015  . Peroneal tendinitis of right lower extremity 11/09/2014  . Systemic lupus erythematosus affecting pregnancy in first trimester ([facility]) 07/14/2014  . Renal disease during pregnancy in first trimester 07/14/2014  . 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 [redacted] weeks gestation of pregnancy 07/14/2014  . Sjogren's syndrome ([facility]) 05/13/2013  . 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 Raynauds syndrome 05/13/2013    Patient declines infusion at this time. Symptoms tier reviewed as well as criteria for ending isolation. Preventative practices reviewed. Patient verbalized understanding.    Patient advised to call back if he/she decides that he/she does want to get infusion. Callback number to the infusion center given. Patient advised to go to Urgent care or ED with severe symptoms.

## 2020-06-16 ENCOUNTER — Other Ambulatory Visit: Payer: Self-pay | Admitting: Family Medicine

## 2020-06-16 DIAGNOSIS — M8580 Other specified disorders of bone density and structure, unspecified site: Secondary | ICD-10-CM

## 2020-06-21 ENCOUNTER — Ambulatory Visit
Admission: RE | Admit: 2020-06-21 | Discharge: 2020-06-21 | Disposition: A | Discharge status: Home / Self Care | Payer: 59 | Source: Ambulatory Visit | Attending: Family Medicine | Admitting: Family Medicine

## 2020-06-21 ENCOUNTER — Other Ambulatory Visit: Payer: Self-pay

## 2020-06-21 DIAGNOSIS — M8580 Other specified disorders of bone density and structure, unspecified site: Secondary | ICD-10-CM

## 2021-03-28 ENCOUNTER — Other Ambulatory Visit (HOSPITAL_BASED_OUTPATIENT_CLINIC_OR_DEPARTMENT_OTHER): Payer: Self-pay

## 2021-03-29 ENCOUNTER — Other Ambulatory Visit (HOSPITAL_BASED_OUTPATIENT_CLINIC_OR_DEPARTMENT_OTHER): Payer: Self-pay

## 2021-03-29 MED ORDER — BOTOX 100 UNITS IJ SOLR
INTRAMUSCULAR | 0 refills | Status: DC
  Filled 2021-03-29 – 2021-04-12 (×3): qty 1, 30d supply, fill #0

## 2021-03-30 ENCOUNTER — Other Ambulatory Visit (HOSPITAL_BASED_OUTPATIENT_CLINIC_OR_DEPARTMENT_OTHER): Payer: Self-pay

## 2021-04-06 ENCOUNTER — Other Ambulatory Visit (HOSPITAL_BASED_OUTPATIENT_CLINIC_OR_DEPARTMENT_OTHER): Payer: Self-pay

## 2021-04-12 ENCOUNTER — Other Ambulatory Visit (HOSPITAL_BASED_OUTPATIENT_CLINIC_OR_DEPARTMENT_OTHER): Payer: Self-pay

## 2021-04-13 ENCOUNTER — Other Ambulatory Visit (HOSPITAL_BASED_OUTPATIENT_CLINIC_OR_DEPARTMENT_OTHER): Payer: Self-pay

## 2021-05-23 ENCOUNTER — Other Ambulatory Visit (HOSPITAL_BASED_OUTPATIENT_CLINIC_OR_DEPARTMENT_OTHER): Payer: Self-pay

## 2021-05-23 MED ORDER — BIMATOPROST 0.03 % EX SOLN
CUTANEOUS | 6 refills | Status: AC
  Filled 2021-05-23: qty 5, 70d supply, fill #0

## 2021-05-24 ENCOUNTER — Other Ambulatory Visit (HOSPITAL_BASED_OUTPATIENT_CLINIC_OR_DEPARTMENT_OTHER): Payer: Self-pay

## 2021-05-25 ENCOUNTER — Other Ambulatory Visit (HOSPITAL_BASED_OUTPATIENT_CLINIC_OR_DEPARTMENT_OTHER): Payer: Self-pay

## 2021-07-13 ENCOUNTER — Other Ambulatory Visit (HOSPITAL_COMMUNITY): Payer: Self-pay

## 2021-10-24 ENCOUNTER — Other Ambulatory Visit (HOSPITAL_BASED_OUTPATIENT_CLINIC_OR_DEPARTMENT_OTHER): Payer: Self-pay

## 2021-10-25 ENCOUNTER — Other Ambulatory Visit (HOSPITAL_BASED_OUTPATIENT_CLINIC_OR_DEPARTMENT_OTHER): Payer: Self-pay

## 2021-10-25 MED ORDER — BOTOX 100 UNITS IJ SOLR
INTRAMUSCULAR | 0 refills | Status: AC
  Filled 2021-10-25: qty 1, 1d supply, fill #0
  Filled 2021-11-02: qty 1, 30d supply, fill #0

## 2021-10-31 ENCOUNTER — Other Ambulatory Visit (HOSPITAL_BASED_OUTPATIENT_CLINIC_OR_DEPARTMENT_OTHER): Payer: Self-pay

## 2021-11-01 ENCOUNTER — Other Ambulatory Visit (HOSPITAL_BASED_OUTPATIENT_CLINIC_OR_DEPARTMENT_OTHER): Payer: Self-pay

## 2021-11-02 ENCOUNTER — Other Ambulatory Visit (HOSPITAL_BASED_OUTPATIENT_CLINIC_OR_DEPARTMENT_OTHER): Payer: Self-pay

## 2021-11-03 ENCOUNTER — Other Ambulatory Visit (HOSPITAL_BASED_OUTPATIENT_CLINIC_OR_DEPARTMENT_OTHER): Payer: Self-pay

## 2021-11-23 ENCOUNTER — Other Ambulatory Visit: Payer: Self-pay

## 2022-03-20 ENCOUNTER — Other Ambulatory Visit (HOSPITAL_BASED_OUTPATIENT_CLINIC_OR_DEPARTMENT_OTHER): Payer: Self-pay

## 2022-03-20 MED ORDER — VYVANSE 70 MG PO CAPS
ORAL_CAPSULE | ORAL | 0 refills | Status: DC
  Filled 2022-03-20: qty 30, 30d supply, fill #0

## 2022-04-03 ENCOUNTER — Other Ambulatory Visit: Payer: Self-pay | Admitting: Obstetrics & Gynecology

## 2022-04-03 DIAGNOSIS — R928 Other abnormal and inconclusive findings on diagnostic imaging of breast: Secondary | ICD-10-CM

## 2022-04-11 ENCOUNTER — Ambulatory Visit
Admission: RE | Admit: 2022-04-11 | Discharge: 2022-04-11 | Disposition: A | Discharge status: Home / Self Care | Payer: 59 | Source: Ambulatory Visit | Attending: Obstetrics & Gynecology | Admitting: Obstetrics & Gynecology

## 2022-04-11 ENCOUNTER — Ambulatory Visit: Payer: 59

## 2022-04-11 DIAGNOSIS — R928 Other abnormal and inconclusive findings on diagnostic imaging of breast: Secondary | ICD-10-CM

## 2022-04-17 ENCOUNTER — Other Ambulatory Visit (HOSPITAL_BASED_OUTPATIENT_CLINIC_OR_DEPARTMENT_OTHER): Payer: Self-pay

## 2022-04-17 MED ORDER — VYVANSE 70 MG PO CAPS
ORAL_CAPSULE | ORAL | 0 refills | Status: DC
  Filled 2022-04-17 – 2022-05-18 (×2): qty 30, 30d supply, fill #0

## 2022-04-17 MED ORDER — DEXTROAMPHETAMINE SULFATE 5 MG PO TABS
ORAL_TABLET | ORAL | 0 refills | Status: AC
Start: 2022-04-17 — End: ?
  Filled 2022-04-17: qty 60, 30d supply, fill #0

## 2022-04-18 ENCOUNTER — Other Ambulatory Visit (HOSPITAL_BASED_OUTPATIENT_CLINIC_OR_DEPARTMENT_OTHER): Payer: Self-pay

## 2022-04-19 ENCOUNTER — Other Ambulatory Visit (HOSPITAL_BASED_OUTPATIENT_CLINIC_OR_DEPARTMENT_OTHER): Payer: Self-pay

## 2022-04-21 ENCOUNTER — Other Ambulatory Visit (HOSPITAL_BASED_OUTPATIENT_CLINIC_OR_DEPARTMENT_OTHER): Payer: Self-pay

## 2022-04-25 ENCOUNTER — Other Ambulatory Visit (HOSPITAL_BASED_OUTPATIENT_CLINIC_OR_DEPARTMENT_OTHER): Payer: Self-pay

## 2022-04-26 ENCOUNTER — Other Ambulatory Visit (HOSPITAL_BASED_OUTPATIENT_CLINIC_OR_DEPARTMENT_OTHER): Payer: Self-pay

## 2022-04-27 ENCOUNTER — Other Ambulatory Visit (HOSPITAL_BASED_OUTPATIENT_CLINIC_OR_DEPARTMENT_OTHER): Payer: Self-pay

## 2022-04-27 MED ORDER — BOTOX 100 UNITS IJ SOLR
INTRAMUSCULAR | 0 refills | Status: DC
  Filled 2022-04-27: qty 1, 1d supply, fill #0

## 2022-04-28 ENCOUNTER — Other Ambulatory Visit (HOSPITAL_BASED_OUTPATIENT_CLINIC_OR_DEPARTMENT_OTHER): Payer: Self-pay

## 2022-05-02 ENCOUNTER — Other Ambulatory Visit (HOSPITAL_BASED_OUTPATIENT_CLINIC_OR_DEPARTMENT_OTHER): Payer: Self-pay

## 2022-05-18 ENCOUNTER — Other Ambulatory Visit (HOSPITAL_BASED_OUTPATIENT_CLINIC_OR_DEPARTMENT_OTHER): Payer: Self-pay

## 2022-05-31 ENCOUNTER — Other Ambulatory Visit (HOSPITAL_COMMUNITY): Payer: Self-pay

## 2022-06-02 ENCOUNTER — Other Ambulatory Visit (HOSPITAL_BASED_OUTPATIENT_CLINIC_OR_DEPARTMENT_OTHER): Payer: Self-pay

## 2022-06-14 ENCOUNTER — Other Ambulatory Visit (HOSPITAL_BASED_OUTPATIENT_CLINIC_OR_DEPARTMENT_OTHER): Payer: Self-pay

## 2022-06-14 MED ORDER — LISDEXAMFETAMINE DIMESYLATE 70 MG PO CAPS
70.0000 mg | ORAL_CAPSULE | Freq: Every morning | ORAL | 0 refills | Status: DC
  Filled 2022-06-14: qty 30, 30d supply, fill #0

## 2022-06-15 ENCOUNTER — Other Ambulatory Visit (HOSPITAL_BASED_OUTPATIENT_CLINIC_OR_DEPARTMENT_OTHER): Payer: Self-pay

## 2022-06-22 ENCOUNTER — Other Ambulatory Visit: Payer: Self-pay | Admitting: Family Medicine

## 2022-06-22 DIAGNOSIS — R1013 Epigastric pain: Secondary | ICD-10-CM

## 2022-06-29 ENCOUNTER — Ambulatory Visit
Admission: RE | Admit: 2022-06-29 | Discharge: 2022-06-29 | Disposition: A | Discharge status: Home / Self Care | Payer: 59 | Source: Ambulatory Visit | Attending: Family Medicine | Admitting: Family Medicine

## 2022-06-29 DIAGNOSIS — R1013 Epigastric pain: Secondary | ICD-10-CM

## 2022-07-17 ENCOUNTER — Other Ambulatory Visit (HOSPITAL_BASED_OUTPATIENT_CLINIC_OR_DEPARTMENT_OTHER): Payer: Self-pay

## 2022-07-17 ENCOUNTER — Encounter (HOSPITAL_BASED_OUTPATIENT_CLINIC_OR_DEPARTMENT_OTHER): Payer: Self-pay | Admitting: Pharmacist

## 2022-07-17 MED ORDER — LISDEXAMFETAMINE DIMESYLATE 70 MG PO CAPS
70.0000 mg | ORAL_CAPSULE | Freq: Every morning | ORAL | 0 refills | Status: DC
  Filled 2022-07-17: qty 30, 30d supply, fill #0

## 2022-07-18 ENCOUNTER — Other Ambulatory Visit (HOSPITAL_BASED_OUTPATIENT_CLINIC_OR_DEPARTMENT_OTHER): Payer: Self-pay

## 2022-08-02 ENCOUNTER — Other Ambulatory Visit (HOSPITAL_BASED_OUTPATIENT_CLINIC_OR_DEPARTMENT_OTHER): Payer: Self-pay

## 2022-08-02 MED ORDER — XOFLUZA (40 MG DOSE) 1 X 40 MG PO TBPK
40.0000 mg | ORAL_TABLET | Freq: Once | ORAL | 0 refills | Status: AC
  Filled 2022-08-02: qty 1, 1d supply, fill #0

## 2022-08-15 ENCOUNTER — Other Ambulatory Visit (HOSPITAL_BASED_OUTPATIENT_CLINIC_OR_DEPARTMENT_OTHER): Payer: Self-pay

## 2022-08-15 MED ORDER — ALBUTEROL SULFATE HFA 108 (90 BASE) MCG/ACT IN AERS
2.0000 | INHALATION_SPRAY | RESPIRATORY_TRACT | 0 refills | Status: AC | PRN
Start: 2022-08-15 — End: ?
  Filled 2022-08-15: qty 18, 17d supply, fill #0

## 2022-08-15 MED ORDER — FLUCONAZOLE 150 MG PO TABS
150.0000 mg | ORAL_TABLET | Freq: Every day | ORAL | 0 refills | Status: AC
  Filled 2022-08-15: qty 10, 10d supply, fill #0

## 2022-08-15 MED ORDER — AMOXICILLIN 875 MG PO TABS
875.0000 mg | ORAL_TABLET | Freq: Two times a day (BID) | ORAL | 0 refills | Status: AC
  Filled 2022-08-15: qty 20, 10d supply, fill #0

## 2022-08-15 MED ORDER — LISDEXAMFETAMINE DIMESYLATE 70 MG PO CAPS
70.0000 mg | ORAL_CAPSULE | Freq: Every morning | ORAL | 0 refills | Status: DC
  Filled 2022-08-15: qty 30, 30d supply, fill #0

## 2022-08-15 MED ORDER — DEXTROAMPHETAMINE SULFATE 5 MG PO TABS
5.0000 mg | ORAL_TABLET | Freq: Every day | ORAL | 0 refills | Status: AC | PRN
  Filled 2022-08-15: qty 60, 30d supply, fill #0

## 2022-09-14 ENCOUNTER — Other Ambulatory Visit (HOSPITAL_BASED_OUTPATIENT_CLINIC_OR_DEPARTMENT_OTHER): Payer: Self-pay

## 2022-09-14 MED ORDER — LISDEXAMFETAMINE DIMESYLATE 70 MG PO CAPS
70.0000 mg | ORAL_CAPSULE | Freq: Every morning | ORAL | 0 refills | Status: DC
  Filled 2022-09-14: qty 30, 30d supply, fill #0

## 2022-09-15 ENCOUNTER — Other Ambulatory Visit (HOSPITAL_BASED_OUTPATIENT_CLINIC_OR_DEPARTMENT_OTHER): Payer: Self-pay

## 2022-09-16 ENCOUNTER — Other Ambulatory Visit (HOSPITAL_BASED_OUTPATIENT_CLINIC_OR_DEPARTMENT_OTHER): Payer: Self-pay

## 2022-09-17 ENCOUNTER — Other Ambulatory Visit (HOSPITAL_BASED_OUTPATIENT_CLINIC_OR_DEPARTMENT_OTHER): Payer: Self-pay

## 2022-09-18 ENCOUNTER — Other Ambulatory Visit (HOSPITAL_BASED_OUTPATIENT_CLINIC_OR_DEPARTMENT_OTHER): Payer: Self-pay

## 2022-09-19 ENCOUNTER — Other Ambulatory Visit (HOSPITAL_BASED_OUTPATIENT_CLINIC_OR_DEPARTMENT_OTHER): Payer: Self-pay

## 2022-10-19 ENCOUNTER — Other Ambulatory Visit (HOSPITAL_BASED_OUTPATIENT_CLINIC_OR_DEPARTMENT_OTHER): Payer: Self-pay

## 2022-10-19 MED ORDER — LISDEXAMFETAMINE DIMESYLATE 70 MG PO CAPS
70.0000 mg | ORAL_CAPSULE | Freq: Every morning | ORAL | 0 refills | Status: DC
  Filled 2022-10-19: qty 30, 30d supply, fill #0

## 2022-11-14 ENCOUNTER — Other Ambulatory Visit (HOSPITAL_BASED_OUTPATIENT_CLINIC_OR_DEPARTMENT_OTHER): Payer: Self-pay

## 2022-11-14 ENCOUNTER — Other Ambulatory Visit (HOSPITAL_COMMUNITY): Payer: Self-pay

## 2022-11-14 ENCOUNTER — Other Ambulatory Visit: Payer: Self-pay

## 2022-11-14 MED ORDER — LISDEXAMFETAMINE DIMESYLATE 70 MG PO CAPS
70.0000 mg | ORAL_CAPSULE | Freq: Every day | ORAL | 0 refills | Status: DC
  Filled 2022-11-16: qty 30, 30d supply, fill #0

## 2022-11-14 MED ORDER — DEXTROAMPHETAMINE SULFATE 5 MG PO TABS
5.0000 mg | ORAL_TABLET | Freq: Every morning | ORAL | 0 refills | Status: AC
  Filled 2022-11-14: qty 60, 30d supply, fill #0

## 2022-11-14 MED ORDER — BOTOX 100 UNITS IJ SOLR
INTRAMUSCULAR | 3 refills | Status: AC
  Filled 2022-11-14: qty 1, 30d supply, fill #0
  Filled 2022-12-04: qty 1, 84d supply, fill #0
  Filled 2023-07-31: qty 1, 84d supply, fill #1
  Filled 2023-10-23 – 2023-10-30 (×3): qty 1, 84d supply, fill #0

## 2022-11-15 ENCOUNTER — Other Ambulatory Visit (HOSPITAL_COMMUNITY): Payer: Self-pay

## 2022-11-16 ENCOUNTER — Other Ambulatory Visit (HOSPITAL_COMMUNITY): Payer: Self-pay

## 2022-11-16 ENCOUNTER — Other Ambulatory Visit (HOSPITAL_BASED_OUTPATIENT_CLINIC_OR_DEPARTMENT_OTHER): Payer: Self-pay

## 2022-11-16 MED ORDER — FLUCONAZOLE 150 MG PO TABS
ORAL_TABLET | ORAL | 0 refills | Status: AC
  Filled 2022-11-16: qty 4, 28d supply, fill #0
  Filled 2023-01-16: qty 4, 28d supply, fill #1
  Filled 2023-02-15: qty 2, 14d supply, fill #2

## 2022-11-16 MED ORDER — MUPIROCIN 2 % EX OINT
TOPICAL_OINTMENT | CUTANEOUS | 3 refills | Status: AC
  Filled 2022-11-16: qty 22, 7d supply, fill #0
  Filled 2023-01-16: qty 22, 7d supply, fill #1
  Filled 2023-10-30: qty 22, 7d supply, fill #2

## 2022-11-16 MED ORDER — CEFDINIR 300 MG PO CAPS
ORAL_CAPSULE | ORAL | 0 refills | Status: AC
  Filled 2022-11-16: qty 20, 10d supply, fill #0

## 2022-11-17 ENCOUNTER — Other Ambulatory Visit (HOSPITAL_COMMUNITY): Payer: Self-pay

## 2022-11-22 ENCOUNTER — Other Ambulatory Visit: Payer: Self-pay

## 2022-12-01 ENCOUNTER — Other Ambulatory Visit (HOSPITAL_COMMUNITY): Payer: Self-pay

## 2022-12-04 ENCOUNTER — Other Ambulatory Visit (HOSPITAL_COMMUNITY): Payer: Self-pay

## 2022-12-04 ENCOUNTER — Other Ambulatory Visit: Payer: Self-pay

## 2022-12-05 ENCOUNTER — Other Ambulatory Visit (HOSPITAL_COMMUNITY): Payer: Self-pay

## 2022-12-05 ENCOUNTER — Other Ambulatory Visit: Payer: Self-pay

## 2022-12-07 ENCOUNTER — Other Ambulatory Visit: Payer: Self-pay

## 2022-12-07 ENCOUNTER — Other Ambulatory Visit (HOSPITAL_COMMUNITY): Payer: Self-pay

## 2022-12-18 ENCOUNTER — Other Ambulatory Visit (HOSPITAL_BASED_OUTPATIENT_CLINIC_OR_DEPARTMENT_OTHER): Payer: Self-pay

## 2022-12-18 MED ORDER — LISDEXAMFETAMINE DIMESYLATE 70 MG PO CAPS
70.0000 mg | ORAL_CAPSULE | Freq: Every morning | ORAL | 0 refills | Status: DC
  Filled 2022-12-18: qty 30, 30d supply, fill #0

## 2023-01-16 ENCOUNTER — Other Ambulatory Visit: Payer: Self-pay

## 2023-01-16 ENCOUNTER — Other Ambulatory Visit (HOSPITAL_BASED_OUTPATIENT_CLINIC_OR_DEPARTMENT_OTHER): Payer: Self-pay

## 2023-01-16 MED ORDER — DEXTROAMPHETAMINE SULFATE 5 MG PO TABS
5.0000 mg | ORAL_TABLET | Freq: Every day | ORAL | 0 refills | Status: AC | PRN
  Filled 2023-01-16: qty 60, 30d supply, fill #0

## 2023-01-17 ENCOUNTER — Other Ambulatory Visit (HOSPITAL_BASED_OUTPATIENT_CLINIC_OR_DEPARTMENT_OTHER): Payer: Self-pay

## 2023-01-17 MED ORDER — LISDEXAMFETAMINE DIMESYLATE 70 MG PO CAPS
70.0000 mg | ORAL_CAPSULE | Freq: Every morning | ORAL | 0 refills | Status: DC
  Filled 2023-01-17: qty 30, 30d supply, fill #0

## 2023-02-15 ENCOUNTER — Other Ambulatory Visit (HOSPITAL_BASED_OUTPATIENT_CLINIC_OR_DEPARTMENT_OTHER): Payer: Self-pay

## 2023-02-15 ENCOUNTER — Other Ambulatory Visit: Payer: Self-pay

## 2023-02-16 ENCOUNTER — Other Ambulatory Visit (HOSPITAL_BASED_OUTPATIENT_CLINIC_OR_DEPARTMENT_OTHER): Payer: Self-pay

## 2023-02-16 MED ORDER — LISDEXAMFETAMINE DIMESYLATE 70 MG PO CAPS
70.0000 mg | ORAL_CAPSULE | Freq: Every morning | ORAL | 0 refills | Status: DC
  Filled 2023-02-16: qty 30, 30d supply, fill #0

## 2023-02-19 ENCOUNTER — Other Ambulatory Visit: Payer: Self-pay

## 2023-02-19 ENCOUNTER — Other Ambulatory Visit (HOSPITAL_BASED_OUTPATIENT_CLINIC_OR_DEPARTMENT_OTHER): Payer: Self-pay

## 2023-02-23 ENCOUNTER — Other Ambulatory Visit (HOSPITAL_BASED_OUTPATIENT_CLINIC_OR_DEPARTMENT_OTHER): Payer: Self-pay

## 2023-03-21 ENCOUNTER — Other Ambulatory Visit (HOSPITAL_BASED_OUTPATIENT_CLINIC_OR_DEPARTMENT_OTHER): Payer: Self-pay

## 2023-03-21 MED ORDER — LISDEXAMFETAMINE DIMESYLATE 70 MG PO CAPS
70.0000 mg | ORAL_CAPSULE | Freq: Every morning | ORAL | 0 refills | Status: DC
  Filled 2023-03-21: qty 30, 30d supply, fill #0

## 2023-03-21 MED ORDER — DEXTROAMPHETAMINE SULFATE 5 MG PO TABS
5.0000 mg | ORAL_TABLET | Freq: Every morning | ORAL | 0 refills | Status: AC
  Filled 2023-03-21: qty 60, 30d supply, fill #0

## 2023-04-19 ENCOUNTER — Other Ambulatory Visit (HOSPITAL_BASED_OUTPATIENT_CLINIC_OR_DEPARTMENT_OTHER): Payer: Self-pay

## 2023-04-19 MED ORDER — DEXTROAMPHETAMINE SULFATE 5 MG PO TABS
5.0000 mg | ORAL_TABLET | Freq: Every morning | ORAL | 0 refills | Status: AC
  Filled 2023-04-19: qty 60, 30d supply, fill #0

## 2023-04-19 MED ORDER — LISDEXAMFETAMINE DIMESYLATE 70 MG PO CAPS
70.0000 mg | ORAL_CAPSULE | Freq: Every morning | ORAL | 0 refills | Status: DC
  Filled 2023-04-19: qty 30, 30d supply, fill #0

## 2023-05-17 ENCOUNTER — Other Ambulatory Visit (HOSPITAL_BASED_OUTPATIENT_CLINIC_OR_DEPARTMENT_OTHER): Payer: Self-pay

## 2023-05-17 MED ORDER — LISDEXAMFETAMINE DIMESYLATE 70 MG PO CAPS
70.0000 mg | ORAL_CAPSULE | Freq: Every morning | ORAL | 0 refills | Status: DC
  Filled 2023-05-17: qty 30, 30d supply, fill #0

## 2023-05-19 ENCOUNTER — Encounter (HOSPITAL_COMMUNITY): Payer: Self-pay

## 2023-05-30 ENCOUNTER — Other Ambulatory Visit (HOSPITAL_BASED_OUTPATIENT_CLINIC_OR_DEPARTMENT_OTHER): Payer: Self-pay

## 2023-05-30 MED ORDER — CLINDAMYCIN PHOS-BENZOYL PEROX 1.2-5 % EX GEL
1.0000 | CUTANEOUS | 99 refills | Status: AC
  Filled 2023-05-30: qty 45, 30d supply, fill #0
  Filled 2024-02-16: qty 45, 30d supply, fill #1

## 2023-05-30 MED ORDER — SULFAMETHOXAZOLE-TRIMETHOPRIM 800-160 MG PO TABS
1.0000 | ORAL_TABLET | Freq: Two times a day (BID) | ORAL | 3 refills | Status: AC
  Filled 2023-05-30: qty 60, 30d supply, fill #0
  Filled 2023-10-30: qty 60, 30d supply, fill #1
  Filled 2024-01-01: qty 60, 30d supply, fill #2

## 2023-05-30 MED ORDER — TRETINOIN 0.1 % EX CREA
1.0000 | TOPICAL_CREAM | Freq: Every evening | CUTANEOUS | 99 refills | Status: AC | PRN
  Filled 2023-05-30: qty 45, 30d supply, fill #0
  Filled 2023-07-31 – 2023-08-08 (×2): qty 45, 30d supply, fill #1

## 2023-06-05 ENCOUNTER — Other Ambulatory Visit: Payer: Self-pay

## 2023-06-05 ENCOUNTER — Encounter (HOSPITAL_COMMUNITY): Payer: Self-pay

## 2023-06-05 NOTE — Progress Notes (Signed)
Changed encounter due date.

## 2023-06-05 NOTE — Progress Notes (Signed)
Disenrolled from Neurology and enrolled in Hyoerhidrosis

## 2023-06-07 ENCOUNTER — Other Ambulatory Visit (HOSPITAL_BASED_OUTPATIENT_CLINIC_OR_DEPARTMENT_OTHER): Payer: Self-pay

## 2023-06-07 MED ORDER — FLUCONAZOLE 150 MG PO TABS
150.0000 mg | ORAL_TABLET | ORAL | 0 refills | Status: AC
  Filled 2023-06-07: qty 5, 15d supply, fill #0
  Filled 2023-07-31: qty 5, 15d supply, fill #1
  Filled 2023-10-30: qty 5, 15d supply, fill #2

## 2023-06-07 MED ORDER — AMOXICILLIN-POT CLAVULANATE 875-125 MG PO TABS
1.0000 | ORAL_TABLET | Freq: Two times a day (BID) | ORAL | 0 refills | Status: AC
  Filled 2023-06-07: qty 20, 10d supply, fill #0

## 2023-06-08 ENCOUNTER — Other Ambulatory Visit: Payer: Self-pay

## 2023-06-10 ENCOUNTER — Other Ambulatory Visit (HOSPITAL_BASED_OUTPATIENT_CLINIC_OR_DEPARTMENT_OTHER): Payer: Self-pay

## 2023-06-10 MED ORDER — CELECOXIB 200 MG PO CAPS
200.0000 mg | ORAL_CAPSULE | Freq: Every day | ORAL | 3 refills | Status: AC
  Filled 2023-06-10: qty 30, 30d supply, fill #0
  Filled 2023-10-30: qty 30, 30d supply, fill #1

## 2023-06-11 ENCOUNTER — Other Ambulatory Visit (HOSPITAL_COMMUNITY): Payer: Self-pay

## 2023-06-14 ENCOUNTER — Other Ambulatory Visit (HOSPITAL_BASED_OUTPATIENT_CLINIC_OR_DEPARTMENT_OTHER): Payer: Self-pay

## 2023-06-14 MED ORDER — XOFLUZA (40 MG DOSE) 1 X 40 MG PO TBPK
40.0000 mg | ORAL_TABLET | Freq: Once | ORAL | 0 refills | Status: AC
  Filled 2023-06-14: qty 1, 1d supply, fill #0

## 2023-06-20 ENCOUNTER — Other Ambulatory Visit (HOSPITAL_BASED_OUTPATIENT_CLINIC_OR_DEPARTMENT_OTHER): Payer: Self-pay

## 2023-06-20 MED ORDER — LISDEXAMFETAMINE DIMESYLATE 70 MG PO CAPS
70.0000 mg | ORAL_CAPSULE | Freq: Every morning | ORAL | 0 refills | Status: DC
  Filled 2023-06-20: qty 30, 30d supply, fill #0

## 2023-06-20 MED ORDER — DEXTROAMPHETAMINE SULFATE 5 MG PO TABS
5.0000 mg | ORAL_TABLET | Freq: Every morning | ORAL | 0 refills | Status: DC
  Filled 2023-06-20: qty 60, 30d supply, fill #0

## 2023-06-21 ENCOUNTER — Other Ambulatory Visit (HOSPITAL_BASED_OUTPATIENT_CLINIC_OR_DEPARTMENT_OTHER): Payer: Self-pay

## 2023-06-25 ENCOUNTER — Other Ambulatory Visit: Payer: Self-pay | Admitting: Family Medicine

## 2023-06-25 ENCOUNTER — Other Ambulatory Visit (HOSPITAL_BASED_OUTPATIENT_CLINIC_OR_DEPARTMENT_OTHER): Payer: Self-pay

## 2023-06-25 DIAGNOSIS — M8588 Other specified disorders of bone density and structure, other site: Secondary | ICD-10-CM

## 2023-06-25 MED ORDER — CITALOPRAM HYDROBROMIDE 20 MG PO TABS
20.0000 mg | ORAL_TABLET | Freq: Every day | ORAL | 3 refills | Status: AC
Start: 2023-06-25 — End: ?
  Filled 2023-06-25: qty 34, 34d supply, fill #0
  Filled 2023-10-23: qty 30, 30d supply, fill #1

## 2023-07-04 ENCOUNTER — Other Ambulatory Visit (HOSPITAL_BASED_OUTPATIENT_CLINIC_OR_DEPARTMENT_OTHER): Payer: Self-pay

## 2023-07-20 ENCOUNTER — Other Ambulatory Visit (HOSPITAL_BASED_OUTPATIENT_CLINIC_OR_DEPARTMENT_OTHER): Payer: Self-pay

## 2023-07-20 MED ORDER — LISDEXAMFETAMINE DIMESYLATE 70 MG PO CAPS
70.0000 mg | ORAL_CAPSULE | Freq: Every morning | ORAL | 0 refills | Status: DC
  Filled 2023-07-20: qty 30, 30d supply, fill #0

## 2023-08-01 ENCOUNTER — Other Ambulatory Visit: Payer: Self-pay

## 2023-08-01 ENCOUNTER — Other Ambulatory Visit (HOSPITAL_BASED_OUTPATIENT_CLINIC_OR_DEPARTMENT_OTHER): Payer: Self-pay

## 2023-08-03 ENCOUNTER — Other Ambulatory Visit: Payer: Self-pay

## 2023-08-08 ENCOUNTER — Other Ambulatory Visit (HOSPITAL_BASED_OUTPATIENT_CLINIC_OR_DEPARTMENT_OTHER): Payer: Self-pay

## 2023-08-09 ENCOUNTER — Other Ambulatory Visit (HOSPITAL_BASED_OUTPATIENT_CLINIC_OR_DEPARTMENT_OTHER): Payer: Self-pay

## 2023-08-09 ENCOUNTER — Other Ambulatory Visit: Payer: Self-pay

## 2023-08-13 ENCOUNTER — Other Ambulatory Visit (HOSPITAL_BASED_OUTPATIENT_CLINIC_OR_DEPARTMENT_OTHER): Payer: Self-pay

## 2023-08-17 ENCOUNTER — Other Ambulatory Visit (HOSPITAL_BASED_OUTPATIENT_CLINIC_OR_DEPARTMENT_OTHER): Payer: Self-pay

## 2023-08-17 ENCOUNTER — Other Ambulatory Visit: Payer: Self-pay

## 2023-08-17 MED ORDER — DEXTROAMPHETAMINE SULFATE 5 MG PO TABS
5.0000 mg | ORAL_TABLET | Freq: Every morning | ORAL | 0 refills | Status: DC
  Filled 2023-08-17: qty 60, 30d supply, fill #0

## 2023-08-17 MED ORDER — LISDEXAMFETAMINE DIMESYLATE 70 MG PO CAPS
70.0000 mg | ORAL_CAPSULE | Freq: Every morning | ORAL | 0 refills | Status: DC
  Filled 2023-08-17: qty 30, 30d supply, fill #0

## 2023-09-06 ENCOUNTER — Other Ambulatory Visit: Payer: Self-pay

## 2023-09-06 NOTE — Progress Notes (Signed)
 Disenrolling - call center has been unsuccessful in reaching patient and botox last filled 4.9.24.

## 2023-09-12 ENCOUNTER — Other Ambulatory Visit (HOSPITAL_BASED_OUTPATIENT_CLINIC_OR_DEPARTMENT_OTHER): Payer: Self-pay

## 2023-09-14 ENCOUNTER — Other Ambulatory Visit (HOSPITAL_BASED_OUTPATIENT_CLINIC_OR_DEPARTMENT_OTHER): Payer: Self-pay

## 2023-09-14 MED ORDER — LISDEXAMFETAMINE DIMESYLATE 70 MG PO CAPS
70.0000 mg | ORAL_CAPSULE | Freq: Every morning | ORAL | 0 refills | Status: DC
  Filled 2023-09-14: qty 30, 30d supply, fill #0

## 2023-09-17 ENCOUNTER — Other Ambulatory Visit (HOSPITAL_BASED_OUTPATIENT_CLINIC_OR_DEPARTMENT_OTHER): Payer: Self-pay

## 2023-09-17 MED ORDER — POTASSIUM CITRATE ER 15 MEQ (1620 MG) PO TBCR
2.0000 | EXTENDED_RELEASE_TABLET | Freq: Two times a day (BID) | ORAL | 4 refills | Status: AC
Start: 2023-02-28 — End: ?
  Filled 2023-09-17: qty 120, 30d supply, fill #0

## 2023-09-19 ENCOUNTER — Other Ambulatory Visit (HOSPITAL_BASED_OUTPATIENT_CLINIC_OR_DEPARTMENT_OTHER): Payer: Self-pay

## 2023-09-19 MED ORDER — POTASSIUM CITRATE ER 15 MEQ (1620 MG) PO TBCR
2.0000 | EXTENDED_RELEASE_TABLET | Freq: Two times a day (BID) | ORAL | 3 refills | Status: AC
  Filled 2023-09-19 – 2024-01-01 (×2): qty 120, 30d supply, fill #0

## 2023-09-30 ENCOUNTER — Other Ambulatory Visit (HOSPITAL_BASED_OUTPATIENT_CLINIC_OR_DEPARTMENT_OTHER): Payer: Self-pay

## 2023-09-30 MED ORDER — ALBUTEROL SULFATE HFA 108 (90 BASE) MCG/ACT IN AERS
2.0000 | INHALATION_SPRAY | Freq: Four times a day (QID) | RESPIRATORY_TRACT | 2 refills | Status: AC | PRN
  Filled 2023-09-30: qty 6.7, 25d supply, fill #0

## 2023-09-30 MED ORDER — ONDANSETRON 4 MG PO TBDP
4.0000 mg | ORAL_TABLET | Freq: Three times a day (TID) | ORAL | 0 refills | Status: AC | PRN
  Filled 2023-09-30: qty 15, 5d supply, fill #0

## 2023-09-30 MED ORDER — XOFLUZA (40 MG DOSE) 1 X 40 MG PO TBPK
40.0000 mg | ORAL_TABLET | Freq: Once | ORAL | 1 refills | Status: AC
  Filled 2023-09-30: qty 1, 1d supply, fill #0

## 2023-09-30 MED ORDER — FLUTICASONE-SALMETEROL 100-50 MCG/ACT IN AEPB
1.0000 | INHALATION_SPRAY | Freq: Two times a day (BID) | RESPIRATORY_TRACT | 5 refills | Status: AC
  Filled 2023-09-30: qty 60, 30d supply, fill #0

## 2023-10-01 ENCOUNTER — Other Ambulatory Visit (HOSPITAL_BASED_OUTPATIENT_CLINIC_OR_DEPARTMENT_OTHER): Payer: Self-pay

## 2023-10-10 ENCOUNTER — Other Ambulatory Visit (HOSPITAL_BASED_OUTPATIENT_CLINIC_OR_DEPARTMENT_OTHER): Payer: Self-pay

## 2023-10-10 MED ORDER — SILVER SULFADIAZINE 1 % EX CREA
TOPICAL_CREAM | CUTANEOUS | 1 refills | Status: DC
  Filled 2023-10-10: qty 25, 30d supply, fill #0
  Filled 2024-01-01: qty 25, 30d supply, fill #1

## 2023-10-10 MED ORDER — NIFEDIPINE ER OSMOTIC RELEASE 30 MG PO TB24
30.0000 mg | ORAL_TABLET | Freq: Every day | ORAL | 11 refills | Status: AC
  Filled 2023-10-10: qty 30, 30d supply, fill #0
  Filled 2024-01-01: qty 30, 30d supply, fill #1

## 2023-10-16 ENCOUNTER — Other Ambulatory Visit (HOSPITAL_BASED_OUTPATIENT_CLINIC_OR_DEPARTMENT_OTHER): Payer: Self-pay

## 2023-10-16 MED ORDER — LISDEXAMFETAMINE DIMESYLATE 70 MG PO CAPS
70.0000 mg | ORAL_CAPSULE | Freq: Every day | ORAL | 0 refills | Status: DC
Start: 2023-10-16 — End: 2023-11-14
  Filled 2023-10-16: qty 30, 30d supply, fill #0

## 2023-10-23 ENCOUNTER — Other Ambulatory Visit (HOSPITAL_BASED_OUTPATIENT_CLINIC_OR_DEPARTMENT_OTHER): Payer: Self-pay

## 2023-10-23 ENCOUNTER — Other Ambulatory Visit: Payer: Self-pay

## 2023-10-24 ENCOUNTER — Other Ambulatory Visit: Payer: Self-pay

## 2023-10-24 ENCOUNTER — Other Ambulatory Visit: Payer: Self-pay | Admitting: Pharmacy Technician

## 2023-10-30 ENCOUNTER — Other Ambulatory Visit: Payer: Self-pay

## 2023-10-30 NOTE — Progress Notes (Signed)
 Specialty Pharmacy Refill Coordination Note  [name] Davis is a 43 y.o. female contacted today regarding refills of specialty medication(s) OnabotulinumtoxinA (Botox)   Patient requested Delivery   Delivery date: 11/01/23   Verified address: [address] - [facility]   Medication will be filled on 03.05.25.

## 2023-10-31 ENCOUNTER — Other Ambulatory Visit: Payer: Self-pay

## 2023-11-12 ENCOUNTER — Other Ambulatory Visit (HOSPITAL_BASED_OUTPATIENT_CLINIC_OR_DEPARTMENT_OTHER): Payer: Self-pay

## 2023-11-12 MED ORDER — XOFLUZA (40 MG DOSE) 1 X 40 MG PO TBPK
ORAL_TABLET | ORAL | 0 refills | Status: AC
  Filled 2023-11-12: qty 1, 1d supply, fill #0

## 2023-11-14 ENCOUNTER — Other Ambulatory Visit (HOSPITAL_BASED_OUTPATIENT_CLINIC_OR_DEPARTMENT_OTHER): Payer: Self-pay

## 2023-11-14 MED ORDER — DEXTROAMPHETAMINE SULFATE 5 MG PO TABS
5.0000 mg | ORAL_TABLET | Freq: Every day | ORAL | 0 refills | Status: AC
  Filled 2023-11-14: qty 30, 30d supply, fill #0

## 2023-11-14 MED ORDER — LISDEXAMFETAMINE DIMESYLATE 70 MG PO CAPS
70.0000 mg | ORAL_CAPSULE | Freq: Every morning | ORAL | 0 refills | Status: DC
  Filled 2023-11-14: qty 30, 30d supply, fill #0

## 2023-12-17 ENCOUNTER — Other Ambulatory Visit (HOSPITAL_BASED_OUTPATIENT_CLINIC_OR_DEPARTMENT_OTHER): Payer: Self-pay

## 2023-12-17 MED ORDER — LISDEXAMFETAMINE DIMESYLATE 70 MG PO CAPS
70.0000 mg | ORAL_CAPSULE | Freq: Every day | ORAL | 0 refills | Status: DC
  Filled 2023-12-17: qty 30, 30d supply, fill #0

## 2023-12-24 ENCOUNTER — Other Ambulatory Visit (HOSPITAL_BASED_OUTPATIENT_CLINIC_OR_DEPARTMENT_OTHER): Payer: Self-pay

## 2023-12-24 MED ORDER — CITALOPRAM HYDROBROMIDE 20 MG PO TABS
20.0000 mg | ORAL_TABLET | Freq: Every day | ORAL | 1 refills | Status: DC
  Filled 2023-12-24: qty 90, 90d supply, fill #0
  Filled 2024-03-28: qty 90, 90d supply, fill #1

## 2023-12-24 MED ORDER — TRAZODONE HCL 100 MG PO TABS
100.0000 mg | ORAL_TABLET | Freq: Every day | ORAL | 1 refills | Status: AC
  Filled 2023-12-24: qty 90, 90d supply, fill #0

## 2024-01-01 ENCOUNTER — Other Ambulatory Visit (HOSPITAL_BASED_OUTPATIENT_CLINIC_OR_DEPARTMENT_OTHER): Payer: Self-pay

## 2024-01-01 ENCOUNTER — Other Ambulatory Visit: Payer: Self-pay

## 2024-01-17 ENCOUNTER — Other Ambulatory Visit (HOSPITAL_BASED_OUTPATIENT_CLINIC_OR_DEPARTMENT_OTHER): Payer: Self-pay

## 2024-01-17 MED ORDER — LISDEXAMFETAMINE DIMESYLATE 70 MG PO CAPS
70.0000 mg | ORAL_CAPSULE | Freq: Every morning | ORAL | 0 refills | Status: DC
  Filled 2024-01-17: qty 30, 30d supply, fill #0

## 2024-01-17 MED ORDER — DEXTROAMPHETAMINE SULFATE 5 MG PO TABS
ORAL_TABLET | ORAL | 0 refills | Status: AC
Start: 2024-01-17 — End: ?
  Filled 2024-01-17: qty 60, 30d supply, fill #0

## 2024-01-19 ENCOUNTER — Other Ambulatory Visit (HOSPITAL_BASED_OUTPATIENT_CLINIC_OR_DEPARTMENT_OTHER): Payer: Self-pay

## 2024-01-19 MED ORDER — AZITHROMYCIN 250 MG PO TABS
ORAL_TABLET | ORAL | 0 refills | Status: AC
  Filled 2024-01-19: qty 6, 5d supply, fill #0

## 2024-01-19 MED ORDER — PREDNISONE 10 MG PO TABS
ORAL_TABLET | ORAL | 0 refills | Status: AC
Start: 2024-01-19 — End: ?
  Filled 2024-01-19: qty 21, 6d supply, fill #0

## 2024-01-21 ENCOUNTER — Other Ambulatory Visit (HOSPITAL_BASED_OUTPATIENT_CLINIC_OR_DEPARTMENT_OTHER): Payer: Self-pay

## 2024-01-21 MED ORDER — PREDNISONE 10 MG (21) PO TBPK
10.0000 mg | ORAL_TABLET | ORAL | 0 refills | Status: AC
  Filled 2024-01-21 – 2024-08-13 (×2): qty 21, 6d supply, fill #0

## 2024-01-21 MED ORDER — AZITHROMYCIN 250 MG PO TABS
ORAL_TABLET | ORAL | 0 refills | Status: AC
  Filled 2024-01-21 – 2024-08-13 (×2): qty 6, 5d supply, fill #0

## 2024-01-22 ENCOUNTER — Other Ambulatory Visit (HOSPITAL_BASED_OUTPATIENT_CLINIC_OR_DEPARTMENT_OTHER): Payer: Self-pay

## 2024-01-22 MED ORDER — OZEMPIC (0.25 OR 0.5 MG/DOSE) 2 MG/3ML ~~LOC~~ SOPN
0.5000 mg | PEN_INJECTOR | SUBCUTANEOUS | 6 refills | Status: AC
  Filled 2024-01-22: qty 3, 28d supply, fill #0
  Filled 2024-02-16: qty 3, 28d supply, fill #1
  Filled 2024-03-12: qty 3, 28d supply, fill #2

## 2024-02-18 ENCOUNTER — Other Ambulatory Visit (HOSPITAL_BASED_OUTPATIENT_CLINIC_OR_DEPARTMENT_OTHER): Payer: Self-pay

## 2024-02-18 MED ORDER — LISDEXAMFETAMINE DIMESYLATE 70 MG PO CAPS
70.0000 mg | ORAL_CAPSULE | Freq: Every morning | ORAL | 0 refills | Status: DC
  Filled 2024-02-18: qty 30, 30d supply, fill #0

## 2024-02-28 ENCOUNTER — Other Ambulatory Visit (HOSPITAL_BASED_OUTPATIENT_CLINIC_OR_DEPARTMENT_OTHER): Payer: Self-pay

## 2024-02-28 MED ORDER — NYSTATIN 100000 UNIT/GM EX OINT
TOPICAL_OINTMENT | Freq: Two times a day (BID) | CUTANEOUS | 1 refills | Status: AC
  Filled 2024-02-28 – 2024-03-14 (×2): qty 30, 30d supply, fill #0
  Filled 2024-09-10: qty 30, 30d supply, fill #1

## 2024-02-28 MED ORDER — FLUCONAZOLE 150 MG PO TABS
150.0000 mg | ORAL_TABLET | ORAL | 0 refills | Status: DC
  Filled 2024-02-28: qty 8, 28d supply, fill #0
  Filled 2024-03-14: qty 10, 30d supply, fill #0
  Filled 2024-07-07: qty 6, 30d supply, fill #1

## 2024-03-03 ENCOUNTER — Other Ambulatory Visit (HOSPITAL_BASED_OUTPATIENT_CLINIC_OR_DEPARTMENT_OTHER): Payer: Self-pay

## 2024-03-03 MED ORDER — POTASSIUM CITRATE ER 15 MEQ (1620 MG) PO TBCR
2.0000 | EXTENDED_RELEASE_TABLET | Freq: Two times a day (BID) | ORAL | 3 refills | Status: AC
  Filled 2024-03-03 – 2024-07-07 (×2): qty 120, 30d supply, fill #0
  Filled 2024-09-10: qty 120, 30d supply, fill #1

## 2024-03-10 ENCOUNTER — Other Ambulatory Visit (HOSPITAL_BASED_OUTPATIENT_CLINIC_OR_DEPARTMENT_OTHER): Payer: Self-pay

## 2024-03-14 ENCOUNTER — Other Ambulatory Visit (HOSPITAL_BASED_OUTPATIENT_CLINIC_OR_DEPARTMENT_OTHER): Payer: Self-pay

## 2024-03-17 ENCOUNTER — Other Ambulatory Visit: Payer: Self-pay

## 2024-03-21 ENCOUNTER — Other Ambulatory Visit (HOSPITAL_BASED_OUTPATIENT_CLINIC_OR_DEPARTMENT_OTHER): Payer: Self-pay

## 2024-03-21 MED ORDER — LISDEXAMFETAMINE DIMESYLATE 70 MG PO CAPS
70.0000 mg | ORAL_CAPSULE | Freq: Every day | ORAL | 0 refills | Status: DC
  Filled 2024-03-21: qty 30, 30d supply, fill #0

## 2024-03-21 MED ORDER — SILVER SULFADIAZINE 1 % EX CREA
1.0000 | TOPICAL_CREAM | Freq: Every day | CUTANEOUS | 1 refills | Status: AC
  Filled 2024-03-21: qty 20, 20d supply, fill #0

## 2024-03-21 MED ORDER — DEXTROAMPHETAMINE SULFATE 5 MG PO TABS
5.0000 mg | ORAL_TABLET | Freq: Every morning | ORAL | 0 refills | Status: AC
  Filled 2024-03-21: qty 60, 30d supply, fill #0

## 2024-03-28 ENCOUNTER — Other Ambulatory Visit (HOSPITAL_BASED_OUTPATIENT_CLINIC_OR_DEPARTMENT_OTHER): Payer: Self-pay

## 2024-03-28 ENCOUNTER — Other Ambulatory Visit: Payer: Self-pay

## 2024-04-02 ENCOUNTER — Other Ambulatory Visit (HOSPITAL_BASED_OUTPATIENT_CLINIC_OR_DEPARTMENT_OTHER): Payer: Self-pay

## 2024-04-02 MED ORDER — OZEMPIC (1 MG/DOSE) 4 MG/3ML ~~LOC~~ SOPN
1.0000 mg | PEN_INJECTOR | SUBCUTANEOUS | 12 refills | Status: AC
  Filled 2024-04-02: qty 3, 28d supply, fill #0
  Filled 2024-09-26: qty 3, 28d supply, fill #1

## 2024-04-02 MED ORDER — OZEMPIC (2 MG/DOSE) 8 MG/3ML ~~LOC~~ SOPN
2.0000 mg | PEN_INJECTOR | SUBCUTANEOUS | 12 refills | Status: AC
  Filled 2024-04-24: qty 3, 28d supply, fill #0
  Filled 2024-05-27: qty 3, 28d supply, fill #1
  Filled 2024-07-14: qty 3, 28d supply, fill #2
  Filled 2024-08-19 – 2024-08-20 (×2): qty 3, 28d supply, fill #3
  Filled 2024-09-26: qty 3, 28d supply, fill #4

## 2024-04-08 ENCOUNTER — Other Ambulatory Visit (HOSPITAL_BASED_OUTPATIENT_CLINIC_OR_DEPARTMENT_OTHER): Payer: Self-pay

## 2024-04-08 MED ORDER — LIDOCAINE VISCOUS HCL 2 % MT SOLN
15.0000 mL | OROMUCOSAL | 1 refills | Status: AC | PRN
  Filled 2024-04-08: qty 100, 1d supply, fill #0

## 2024-04-15 ENCOUNTER — Other Ambulatory Visit (HOSPITAL_COMMUNITY): Payer: Self-pay

## 2024-04-17 ENCOUNTER — Other Ambulatory Visit: Payer: Self-pay

## 2024-04-18 ENCOUNTER — Other Ambulatory Visit: Payer: Self-pay

## 2024-04-21 ENCOUNTER — Other Ambulatory Visit (HOSPITAL_BASED_OUTPATIENT_CLINIC_OR_DEPARTMENT_OTHER): Payer: Self-pay

## 2024-04-21 MED ORDER — DEXTROAMPHETAMINE SULFATE 5 MG PO TABS
5.0000 mg | ORAL_TABLET | Freq: Every morning | ORAL | 0 refills | Status: AC
  Filled 2024-04-21: qty 60, 30d supply, fill #0

## 2024-04-21 MED ORDER — LISDEXAMFETAMINE DIMESYLATE 70 MG PO CAPS
70.0000 mg | ORAL_CAPSULE | Freq: Every morning | ORAL | 0 refills | Status: DC
  Filled 2024-04-21: qty 30, 30d supply, fill #0

## 2024-04-24 ENCOUNTER — Other Ambulatory Visit (HOSPITAL_BASED_OUTPATIENT_CLINIC_OR_DEPARTMENT_OTHER): Payer: Self-pay

## 2024-04-24 ENCOUNTER — Other Ambulatory Visit: Payer: Self-pay

## 2024-05-21 ENCOUNTER — Other Ambulatory Visit (HOSPITAL_BASED_OUTPATIENT_CLINIC_OR_DEPARTMENT_OTHER): Payer: Self-pay

## 2024-05-21 MED ORDER — DEXTROAMPHETAMINE SULFATE 5 MG PO TABS
5.0000 mg | ORAL_TABLET | Freq: Every day | ORAL | 0 refills | Status: AC | PRN
  Filled 2024-05-21: qty 60, 30d supply, fill #0

## 2024-05-21 MED ORDER — LISDEXAMFETAMINE DIMESYLATE 70 MG PO CAPS
70.0000 mg | ORAL_CAPSULE | Freq: Every morning | ORAL | 0 refills | Status: DC
  Filled 2024-05-21: qty 30, 30d supply, fill #0

## 2024-05-22 ENCOUNTER — Other Ambulatory Visit (HOSPITAL_BASED_OUTPATIENT_CLINIC_OR_DEPARTMENT_OTHER): Payer: Self-pay

## 2024-05-22 MED ORDER — XOFLUZA (40 MG DOSE) 1 X 40 MG PO TBPK
40.0000 mg | ORAL_TABLET | Freq: Once | ORAL | 0 refills | Status: AC
  Filled 2024-05-22: qty 1, 1d supply, fill #0

## 2024-06-02 ENCOUNTER — Other Ambulatory Visit (HOSPITAL_BASED_OUTPATIENT_CLINIC_OR_DEPARTMENT_OTHER): Payer: Self-pay

## 2024-06-19 ENCOUNTER — Other Ambulatory Visit (HOSPITAL_BASED_OUTPATIENT_CLINIC_OR_DEPARTMENT_OTHER): Payer: Self-pay

## 2024-06-19 ENCOUNTER — Other Ambulatory Visit: Payer: Self-pay

## 2024-06-19 MED ORDER — LISDEXAMFETAMINE DIMESYLATE 70 MG PO CAPS
70.0000 mg | ORAL_CAPSULE | Freq: Every morning | ORAL | 0 refills | Status: DC
  Filled 2024-06-19: qty 30, 30d supply, fill #0

## 2024-06-19 MED ORDER — ISOTRETINOIN 10 MG PO CAPS
10.0000 mg | ORAL_CAPSULE | Freq: Every day | ORAL | 0 refills | Status: DC
  Filled 2024-06-19: qty 30, 30d supply, fill #0

## 2024-06-19 MED ORDER — DEXTROAMPHETAMINE SULFATE 5 MG PO TABS
5.0000 mg | ORAL_TABLET | Freq: Every morning | ORAL | 0 refills | Status: AC
  Filled 2024-06-19: qty 60, 30d supply, fill #0

## 2024-06-20 ENCOUNTER — Other Ambulatory Visit (HOSPITAL_BASED_OUTPATIENT_CLINIC_OR_DEPARTMENT_OTHER): Payer: Self-pay

## 2024-06-25 ENCOUNTER — Other Ambulatory Visit: Payer: Self-pay

## 2024-07-07 ENCOUNTER — Other Ambulatory Visit: Payer: Self-pay

## 2024-07-07 ENCOUNTER — Other Ambulatory Visit (HOSPITAL_BASED_OUTPATIENT_CLINIC_OR_DEPARTMENT_OTHER): Payer: Self-pay

## 2024-07-08 ENCOUNTER — Other Ambulatory Visit (HOSPITAL_BASED_OUTPATIENT_CLINIC_OR_DEPARTMENT_OTHER): Payer: Self-pay

## 2024-07-08 MED ORDER — CITALOPRAM HYDROBROMIDE 20 MG PO TABS
20.0000 mg | ORAL_TABLET | Freq: Every day | ORAL | 1 refills | Status: AC
  Filled 2024-07-08: qty 30, 30d supply, fill #0
  Filled 2024-08-13: qty 30, 30d supply, fill #1
  Filled 2024-09-10: qty 30, 30d supply, fill #2

## 2024-07-09 ENCOUNTER — Other Ambulatory Visit: Payer: Self-pay

## 2024-07-21 ENCOUNTER — Other Ambulatory Visit (HOSPITAL_BASED_OUTPATIENT_CLINIC_OR_DEPARTMENT_OTHER): Payer: Self-pay

## 2024-07-21 MED ORDER — TRAZODONE HCL 100 MG PO TABS
100.0000 mg | ORAL_TABLET | Freq: Every day | ORAL | 3 refills | Status: AC
  Filled 2024-07-21: qty 90, 90d supply, fill #0
  Filled 2024-09-10: qty 90, 90d supply, fill #1

## 2024-07-21 MED ORDER — DEXTROAMPHETAMINE SULFATE 5 MG PO TABS
5.0000 mg | ORAL_TABLET | Freq: Every day | ORAL | 0 refills | Status: AC | PRN
  Filled 2024-07-21: qty 60, 30d supply, fill #0

## 2024-07-21 MED ORDER — LISDEXAMFETAMINE DIMESYLATE 70 MG PO CAPS
70.0000 mg | ORAL_CAPSULE | Freq: Every morning | ORAL | 0 refills | Status: DC
  Filled 2024-07-21: qty 30, 30d supply, fill #0

## 2024-07-23 ENCOUNTER — Other Ambulatory Visit (HOSPITAL_BASED_OUTPATIENT_CLINIC_OR_DEPARTMENT_OTHER): Payer: Self-pay

## 2024-07-23 MED ORDER — ISOTRETINOIN 10 MG PO CAPS
10.0000 mg | ORAL_CAPSULE | Freq: Every evening | ORAL | 0 refills | Status: AC
  Filled 2024-08-15: qty 30, 30d supply, fill #0

## 2024-07-28 ENCOUNTER — Ambulatory Visit (HOSPITAL_BASED_OUTPATIENT_CLINIC_OR_DEPARTMENT_OTHER): Admitting: Student

## 2024-07-28 ENCOUNTER — Ambulatory Visit (HOSPITAL_BASED_OUTPATIENT_CLINIC_OR_DEPARTMENT_OTHER)

## 2024-07-28 DIAGNOSIS — M25562 Pain in left knee: Secondary | ICD-10-CM

## 2024-07-28 MED ORDER — TRIAMCINOLONE ACETONIDE 40 MG/ML IJ SUSP
2.0000 mL | INTRAMUSCULAR | Status: AC | PRN
  Administered 2024-07-28: 2 mL via INTRA_ARTICULAR

## 2024-07-28 MED ORDER — LIDOCAINE HCL 1 % IJ SOLN
4.0000 mL | INTRAMUSCULAR | Status: AC | PRN
  Administered 2024-07-28: 4 mL

## 2024-07-28 NOTE — Progress Notes (Signed)
 Chief Complaint: Left knee pain    Discussed the use of AI scribe software for clinical note transcription with the patient, who gave verbal consent to proceed.  History of Present Illness [name] Davis is a 43 year old female who presents with knee pain.  She has had severe right knee pain for about 1.5 weeks. It starts as mild discomfort while walking and escalates to intense throbbing pain, mainly along the lateral knee and sometimes wrapping to the front. Pain worsens with walking and when she stops and then tries to resume walking. At peak intensity it makes her feel nauseated. She denies recent trauma. Because of kidney disease and a prior ulcer she avoids most NSAIDs. She has used Tylenol  and Motrin  in the past and more recently switched to Celebrex  to reduce stomach issues but is worried about long term use. She denies swelling but notes intermittent popping and clicking. She typically runs 3 miles daily and uses a [employer] for 30 minutes each morning but has had to reduce both due to pain and difficulty resting and icing the knee. She is concerned about controlling the knee pain during upcoming travel because walking and prolonged activity aggravate her symptoms.   Surgical History:   None  PMH/PSH/Family History/Social History/Meds/Allergies:    Past Medical History:  Diagnosis Date   Asthma    GERD (gastroesophageal reflux disease)    History of shingles    HSV-1 (herpes simplex virus 1) infection    denies any vaginal lesiona   Hx of varicella    IBS (irritable bowel syndrome)    Lupus (HCC)    Osteopenia    Sjoegren syndrome    Vaginal Pap smear, abnormal    Past Surgical History:  Procedure Laterality Date   AUGMENTATION MAMMAPLASTY Bilateral    BREAST SURGERY     aug   GYNECOLOGIC CRYOSURGERY     MOUTH SURGERY     gum grafts   Social History   Socioeconomic History   Marital status: Married    Spouse name: Not on file    Number of children: Not on file   Years of education: Not on file   Highest education level: Not on file  Occupational History   Not on file  Tobacco Use   Smoking status: Former    Types: Cigarettes   Smokeless tobacco: Not on file  Substance and Sexual Activity   Alcohol use: Yes    Comment: occassional   Drug use: No   Sexual activity: Yes    Partners: Male  Other Topics Concern   Not on file  Social History Narrative   Not on file   Social Drivers of Health   Financial Resource Strain: Not on file  Food Insecurity: Not on file  Transportation Needs: Not on file  Physical Activity: Not on file  Stress: Not on file  Social Connections: Not on file   Family History  Problem Relation Age of Onset   Cancer Maternal Grandfather        multiple myeloma   Thyroid disease Mother    Thyroid disease Sister    Heart attack Maternal Grandmother    Diabetes Maternal Grandmother    Thyroid disease Paternal Grandmother    No Known Allergies Current Outpatient Medications  Medication Sig Dispense Refill   albuterol  (VENTOLIN   HFA) 108 (90 Base) MCG/ACT inhaler Inhale 2 puffs into the lungs every 4 (four) - 6 (six) hours as needed. 18 g 0   albuterol  (VENTOLIN  HFA) 108 (90 Base) MCG/ACT inhaler Inhale 2 puffs into the lungs every 6 (six) hours as needed for wheezing. 6.7 g 2   amoxicillin -clavulanate (AUGMENTIN ) 875-125 MG tablet Take 1 tablet by mouth every 12 (twelve) hours for 10 days. 20 tablet 0   azithromycin  (ZITHROMAX ) 250 MG tablet Take 2 tablets (500mg ) by mouth on day 1. Then take 1 tablet (250mg ) by mouth on days 2 through 5. 6 tablet 0   azithromycin  (ZITHROMAX ) 250 MG tablet Take 2 tablets (500mg ) by mouth on Day 1. Take 1 tablet (250mg ) by mouth on Days 2-5. 6 tablet 0   Baloxavir Marboxil ,40 MG Dose, (XOFLUZA , 40 MG DOSE,) 1 x 40 MG TBPK Take 1 tablet by mouth once for 1 dose  immediately post exposure for flu prophylaxis 1 each 0   bimatoprost  (LATISSE ) 0.03 %  ophthalmic solution Use as directed 5 mL 6   botulinum toxin Type A  (BOTOX ) 100 units SOLR injection Bring to office for administration 1 each 0   botulinum toxin Type A  (BOTOX ) 100 units SOLR injection Bring to office for administration. 1 each 3   cefdinir  (OMNICEF ) 300 MG capsule Take 1 capsule (300 mg total) by mouth every 12 (twelve) hours for 10 days 20 capsule 0   celecoxib  (CELEBREX ) 200 MG capsule Take 1 capsule (200 mg total) by mouth daily with food for pain and swelling. 30 capsule 3   citalopram  (CELEXA ) 20 MG tablet Take 1 tablet (20 mg total) by mouth daily. 90 tablet 3   citalopram  (CELEXA ) 20 MG tablet Take 1 tablet (20 mg total) by mouth daily. 90 tablet 1   Clindamycin -Benzoyl Per, Refr, gel Apply 1 Application topically every morning. 45 g PRN   dextroamphetamine  (DEXTROSTAT ) 5 MG tablet 1-2 tablets in the morning Orally Once a day prn 60 tablet 0   dextroamphetamine  (DEXTROSTAT ) 5 MG tablet Take 1-2 tablets (5-10 mg total) by mouth daily as needed. 60 tablet 0   dextroamphetamine  (DEXTROSTAT ) 5 MG tablet Take 1-2 tablets (5-10 mg total) by mouth in the morning as needed. 60 tablet 0   dextroamphetamine  (DEXTROSTAT ) 5 MG tablet Take 1-2 tablets (5-10 mg total) by mouth daily as needed. 60 tablet 0   dextroamphetamine  (DEXTROSTAT ) 5 MG tablet Take 1-2 tablets (5-10 mg total) by mouth in the morning as needed. 60 tablet 0   dextroamphetamine  (DEXTROSTAT ) 5 MG tablet Take 1-2 tablets (5-10 mg total) by mouth every morning. 60 tablet 0   dextroamphetamine  (DEXTROSTAT ) 5 MG tablet Take 1 tablet (5 mg total) by mouth daily. 60 tablet 0   dextroamphetamine  (DEXTROSTAT ) 5 MG tablet Take 1-2 tablets in the morning Orally Once a day as needed 60 tablet 0   dextroamphetamine  (DEXTROSTAT ) 5 MG tablet Take 1-2 tablets (5-10 mg total) by mouth in the morning. 60 tablet 0   dextroamphetamine  (DEXTROSTAT ) 5 MG tablet Take 1-2 tablets (5-10 mg total) by mouth every morning as needed. 60 tablet 0    dextroamphetamine  (DEXTROSTAT ) 5 MG tablet Take 1-2 tablets (5-10 mg total) by mouth daily in the morning as needed. 60 tablet 0   dextroamphetamine  (DEXTROSTAT ) 5 MG tablet Take 1-2 tablets (5-10 mg total) by mouth in the morning as needed. 60 tablet 0   dextroamphetamine  (DEXTROSTAT ) 5 MG tablet Take 1-2 tablets (5-10 mg total) by mouth in the morning  as needed. 60 tablet 0   docusate sodium  (COLACE) 100 MG capsule Take 100 mg by mouth 2 (two) times daily as needed for mild constipation.     fluconazole  (DIFLUCAN ) 150 MG tablet Take one tablet by mouth weekly x 10 weeks 10 tablet 0   fluconazole  (DIFLUCAN ) 150 MG tablet Take 1 tablet (150 mg total) by mouth every 3 (three) days until resolution. Repeat as needed. 15 tablet 0   fluconazole  (DIFLUCAN ) 150 MG tablet Take 1 tablet (150 mg total) by mouth once a week for 16 doses Take one at symptom onset take second tablet in 3-5 days 16 tablet 0   fluticasone -salmeterol (ADVAIR  DISKUS) 100-50 MCG/ACT AEPB Inhale 1 puff into the lungs every 12 (twelve) hours. 60 each 5   HYDROcodone -acetaminophen  (NORCO) 5-325 MG tablet Take 1-2 tablets by mouth every 6 (six) hours as needed for severe pain. 12 tablet 0   ibuprofen  (ADVIL ,MOTRIN ) 600 MG tablet Take 1 tablet (600 mg total) by mouth every 8 (eight) hours as needed for moderate pain. 30 tablet 0   isotretinoin  (ACCUTANE ) 10 MG capsule Take 1 capsule (10 mg total) by mouth every evening with dinner on a full stomach 30 capsule 0   lidocaine  (XYLOCAINE ) 2 % solution Use as directed 15 mLs in the mouth or throat every 4 (four) hours as needed (for sore throat) for up to 3 days Gargle and spit solution. Max 8 doses/day. 100 mL 1   lisdexamfetamine (VYVANSE ) 50 MG capsule Take 50 mg by mouth daily.     lisdexamfetamine (VYVANSE ) 70 MG capsule Take 1 capsule (70 mg total) by mouth in the morning. 30 capsule 0   mupirocin  ointment (BACTROBAN ) 2 % Apply topically 3 (three) times daily for 7 days 22 g 3    NIFEdipine  (PROCARDIA -XL/NIFEDICAL-XL) 30 MG 24 hr tablet Take 1 tablet (30 mg total) by mouth daily. 30 tablet 11   nystatin  ointment (MYCOSTATIN ) Apply topically 2 (two) times daily 30 g 1   ondansetron  (ZOFRAN -ODT) 4 MG disintegrating tablet Take 1 tablet (4 mg total) by mouth every 8 (eight) hours as needed for nausea or vomiting. 20 tablet 0   ondansetron  (ZOFRAN -ODT) 4 MG disintegrating tablet Take 1 tablet (4 mg total) by mouth every 8 (eight) hours as needed. 15 tablet 0   oxyCODONE -acetaminophen  (PERCOCET/ROXICET) 5-325 MG per tablet Take 1 tablet by mouth every 4 (four) hours as needed (for pain scale 4-7). 30 tablet 0   potassium chloride  SA (K-DUR,KLOR-CON ) 20 MEQ tablet Take 1 tablet (20 mEq total) by mouth daily. 3 tablet 0   Potassium Citrate  15 MEQ (1620 MG) TBCR Take 2 tablets by mouth 2 (two) times daily with meals. 360 tablet 4   Potassium Citrate  15 MEQ (1620 MG) TBCR Take 2 tablets by mouth 2 (two) times daily with meals. 360 tablet 3   Potassium Citrate  15 MEQ (1620 MG) TBCR Take 2 tablets by mouth 2 (two) times daily with a meal. 360 tablet 3   predniSONE  (DELTASONE ) 10 MG tablet Take 6 tablets by mouth on day 1, 5 tabs on day 2, 4 tabs on day 3, 3 tabs on day 4, 2 tabs on day 5, 1 tab on day 6. Then stop. 21 tablet 0   predniSONE  (DELTASONE ) 10 MG tablet Take 6 tablets daily for 1 day, then 5 tabs daily for 1 day, then 4 tabs daily for 1 day, then 3 tabs daily for 1 day, then 2 tabs daily for 1 day, then 1  tablet daily. 21 tablet 0   Prenatal Vit-Fe Fumarate-FA (PRENATAL MULTIVITAMIN) TABS Take 1 tablet by mouth daily at 12 noon.     Semaglutide , 1 MG/DOSE, (OZEMPIC , 1 MG/DOSE,) 4 MG/3ML SOPN Inject 1 mg into the skin once a week. 3 mL 12   Semaglutide , 2 MG/DOSE, (OZEMPIC , 2 MG/DOSE,) 8 MG/3ML SOPN Inject 2 mg into the skin once a week. 3 mL 12   Semaglutide ,0.25 or 0.5MG /DOS, (OZEMPIC , 0.25 OR 0.5 MG/DOSE,) 2 MG/3ML SOPN Inject 0.5 mg into the skin once a week. 3 mL 6    silver  sulfADIAZINE  (SILVADENE ) 1 % cream Apply 1 application topically to area of injury daily. 20 g 1   sulfamethoxazole -trimethoprim  (BACTRIM  DS) 800-160 MG tablet Take 1 tablet by mouth 2 (two) times daily with food/water as needed. Be cautious of sun sensitivity. 60 tablet 3   tamsulosin  (FLOMAX ) 0.4 MG CAPS capsule Take 1 capsule (0.4 mg total) by mouth daily. 14 capsule 0   traZODone  (DESYREL ) 100 MG tablet Take 1 tablet (100 mg total) by mouth daily. 90 tablet 1   traZODone  (DESYREL ) 100 MG tablet Take 1 tablet (100 mg total) by mouth daily. 90 tablet 3   tretinoin  (RETIN-A ) 0.1 % cream Apply 1 Application topically at bedtime as needed. 45 g PRN   No current facility-administered medications for this visit.   No results found.  Review of Systems:   A ROS was performed including pertinent positives and negatives as documented in the HPI.  Physical Exam :   Constitutional: NAD and appears stated age Neurological: Alert and oriented Psych: Appropriate affect and cooperative unknown if currently breastfeeding.   Comprehensive Musculoskeletal Exam:    Exam the left knee demonstrates full active range of motion from 0 to 135 degrees.  Tenderness with palpation over the lateral joint line and distal IT band.  No snapping palpable with motion.  No patellar, medial, or posterior tenderness.  Calf is supple and nontender.  Stable collaterals with varus and valgus stress.  Imaging:   Xray (left knee 4 views): Negative radiographs of the left knee   I personally reviewed and interpreted the radiographs.      Assessment & Plan Left lateral knee pain Patient presents with over 1 week history of atraumatic lateral pain of the left knee.  This does appear to be most consistent with IT band syndrome based on her exam, however she does demonstrate tenderness directly over the lateral joint line.  NSAIDs have provided some temporary relief although she has history of stomach ulcers and  kidney disease from long-term use therefore I would like to wean her off of these.  A steroid injection was discussed for symptomatic relief and patient was agreeable to proceed with this.  Injection was performed successfully in the left knee which she tolerated well.  Provided resources for IT band stretches to perform at home.  Will also refer to formal physical therapy at [facility] to help address this.     Procedure Note  Patient: [name] Davis             Date of Birth: 02-Mar-1981           MRN: [id number]             Visit Date: 07/28/2024  Procedures: Visit Diagnoses:  1. Acute pain of left knee     Large Joint Inj: L knee on 07/28/2024 5:24 PM Indications: pain Details: 22 G 1.5 in needle, anterolateral approach Medications: 4 mL lidocaine  1 %;  2 mL triamcinolone acetonide 40 MG/ML Outcome: tolerated well, no immediate complications Procedure, treatment alternatives, risks and benefits explained, specific risks discussed. Consent was given by the patient. Immediately prior to procedure a time out was called to verify the correct patient, procedure, equipment, support staff and site/side marked as required. Patient was prepped and draped in the usual sterile fashion.        I personally saw and evaluated the patient, and participated in the management and treatment plan.  Leonce Reveal, PA-C Orthopedics

## 2024-08-08 ENCOUNTER — Other Ambulatory Visit (HOSPITAL_BASED_OUTPATIENT_CLINIC_OR_DEPARTMENT_OTHER): Payer: Self-pay

## 2024-08-08 MED ORDER — ISOTRETINOIN 10 MG PO CAPS
10.0000 mg | ORAL_CAPSULE | Freq: Every evening | ORAL | 0 refills | Status: AC
  Filled 2024-08-13: qty 30, 30d supply, fill #0

## 2024-08-13 ENCOUNTER — Other Ambulatory Visit (HOSPITAL_BASED_OUTPATIENT_CLINIC_OR_DEPARTMENT_OTHER): Payer: Self-pay

## 2024-08-15 ENCOUNTER — Other Ambulatory Visit (HOSPITAL_BASED_OUTPATIENT_CLINIC_OR_DEPARTMENT_OTHER): Payer: Self-pay

## 2024-08-18 ENCOUNTER — Other Ambulatory Visit (HOSPITAL_BASED_OUTPATIENT_CLINIC_OR_DEPARTMENT_OTHER): Payer: Self-pay

## 2024-08-18 MED ORDER — XOFLUZA (40 MG DOSE) 1 X 40 MG PO TBPK
ORAL_TABLET | ORAL | 0 refills | Status: AC
  Filled 2024-08-18: qty 1, 1d supply, fill #0

## 2024-08-18 MED ORDER — XOFLUZA (40 MG DOSE) 1 X 40 MG PO TBPK
40.0000 mg | ORAL_TABLET | Freq: Once | ORAL | 0 refills | Status: AC
  Filled 2024-08-19 – 2024-09-10 (×2): qty 1, 1d supply, fill #0

## 2024-08-18 MED ORDER — FLUCONAZOLE 150 MG PO TABS
ORAL_TABLET | ORAL | 2 refills | Status: AC
  Filled 2024-08-18: qty 2, 3d supply, fill #0
  Filled 2024-09-10: qty 2, 3d supply, fill #1

## 2024-08-19 ENCOUNTER — Other Ambulatory Visit (HOSPITAL_BASED_OUTPATIENT_CLINIC_OR_DEPARTMENT_OTHER): Payer: Self-pay

## 2024-08-19 MED ORDER — ISOTRETINOIN 10 MG PO CAPS
10.0000 mg | ORAL_CAPSULE | Freq: Every day | ORAL | 0 refills | Status: AC
  Filled 2024-08-19: qty 30, 30d supply, fill #0

## 2024-08-20 ENCOUNTER — Other Ambulatory Visit (HOSPITAL_BASED_OUTPATIENT_CLINIC_OR_DEPARTMENT_OTHER): Payer: Self-pay

## 2024-08-20 ENCOUNTER — Other Ambulatory Visit: Payer: Self-pay

## 2024-08-20 MED ORDER — DEXTROAMPHETAMINE SULFATE 5 MG PO TABS
5.0000 mg | ORAL_TABLET | Freq: Every morning | ORAL | 0 refills | Status: DC
  Filled 2024-08-20: qty 60, 30d supply, fill #0

## 2024-08-20 MED ORDER — LISDEXAMFETAMINE DIMESYLATE 70 MG PO CAPS
70.0000 mg | ORAL_CAPSULE | Freq: Every morning | ORAL | 0 refills | Status: DC
  Filled 2024-08-20: qty 30, 30d supply, fill #0

## 2024-09-03 ENCOUNTER — Other Ambulatory Visit (HOSPITAL_BASED_OUTPATIENT_CLINIC_OR_DEPARTMENT_OTHER): Payer: Self-pay

## 2024-09-10 ENCOUNTER — Other Ambulatory Visit (HOSPITAL_BASED_OUTPATIENT_CLINIC_OR_DEPARTMENT_OTHER): Payer: Self-pay

## 2024-09-16 ENCOUNTER — Other Ambulatory Visit (HOSPITAL_BASED_OUTPATIENT_CLINIC_OR_DEPARTMENT_OTHER): Payer: Self-pay

## 2024-09-16 MED ORDER — ISOTRETINOIN 20 MG PO CAPS
20.0000 mg | ORAL_CAPSULE | Freq: Every day | ORAL | 0 refills | Status: AC
  Filled 2024-09-16 – 2024-09-18 (×2): qty 30, 30d supply, fill #0

## 2024-09-17 ENCOUNTER — Other Ambulatory Visit: Payer: Self-pay

## 2024-09-17 ENCOUNTER — Other Ambulatory Visit (HOSPITAL_BASED_OUTPATIENT_CLINIC_OR_DEPARTMENT_OTHER): Payer: Self-pay

## 2024-09-18 ENCOUNTER — Other Ambulatory Visit (HOSPITAL_BASED_OUTPATIENT_CLINIC_OR_DEPARTMENT_OTHER): Payer: Self-pay

## 2024-09-18 ENCOUNTER — Other Ambulatory Visit: Payer: Self-pay

## 2024-09-19 ENCOUNTER — Other Ambulatory Visit (HOSPITAL_BASED_OUTPATIENT_CLINIC_OR_DEPARTMENT_OTHER): Payer: Self-pay

## 2024-09-19 MED ORDER — LISDEXAMFETAMINE DIMESYLATE 70 MG PO CAPS
70.0000 mg | ORAL_CAPSULE | Freq: Every morning | ORAL | 0 refills | Status: AC
  Filled 2024-09-19: qty 30, 30d supply, fill #0

## 2024-09-19 MED ORDER — DEXTROAMPHETAMINE SULFATE 5 MG PO TABS
5.0000 mg | ORAL_TABLET | Freq: Every morning | ORAL | 0 refills | Status: AC
  Filled 2024-09-19: qty 60, 30d supply, fill #0

## 2024-09-20 ENCOUNTER — Other Ambulatory Visit (HOSPITAL_BASED_OUTPATIENT_CLINIC_OR_DEPARTMENT_OTHER): Payer: Self-pay

## 2024-09-25 ENCOUNTER — Other Ambulatory Visit: Payer: Self-pay
